# Patient Record
Sex: Female | Born: 2000
Health system: Southern US, Community
[De-identification: ages and names within clinical notes are randomized; demographics above are authoritative.]

## PROBLEM LIST (undated history)

## (undated) DIAGNOSIS — Z8619 Personal history of other infectious and parasitic diseases: Secondary | ICD-10-CM

## (undated) DIAGNOSIS — Z789 Other specified health status: Secondary | ICD-10-CM

## (undated) HISTORY — DX: Other specified health status: Z78.9

## (undated) HISTORY — PX: NO PAST SURGERIES: SHX2092

---
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
## 2018-06-22 ENCOUNTER — Other Ambulatory Visit: Payer: Self-pay

## 2018-06-22 ENCOUNTER — Emergency Department (HOSPITAL_COMMUNITY)
Admission: EM | Admit: 2018-06-22 | Discharge: 2018-06-22 | Disposition: A | Discharge status: Home / Self Care | Payer: BLUE CROSS/BLUE SHIELD | Attending: Emergency Medicine | Admitting: Emergency Medicine

## 2018-06-22 ENCOUNTER — Encounter (HOSPITAL_COMMUNITY): Payer: Self-pay

## 2018-06-22 DIAGNOSIS — B9789 Other viral agents as the cause of diseases classified elsewhere: Secondary | ICD-10-CM | POA: Insufficient documentation

## 2018-06-22 DIAGNOSIS — Z7722 Contact with and (suspected) exposure to environmental tobacco smoke (acute) (chronic): Secondary | ICD-10-CM | POA: Insufficient documentation

## 2018-06-22 DIAGNOSIS — R0789 Other chest pain: Secondary | ICD-10-CM | POA: Insufficient documentation

## 2018-06-22 DIAGNOSIS — J040 Acute laryngitis: Secondary | ICD-10-CM | POA: Insufficient documentation

## 2018-06-22 DIAGNOSIS — J069 Acute upper respiratory infection, unspecified: Secondary | ICD-10-CM

## 2018-06-22 DIAGNOSIS — J039 Acute tonsillitis, unspecified: Secondary | ICD-10-CM

## 2018-06-22 DIAGNOSIS — R490 Dysphonia: Secondary | ICD-10-CM | POA: Insufficient documentation

## 2018-06-22 LAB — GROUP A STREP BY PCR: Group A Strep by PCR: NOT DETECTED

## 2018-06-22 NOTE — ED Triage Notes (Signed)
Pt states she has had a cough, sore throat, and URI symptoms over the weekend. Pt states that she has been taking Tylenol Cold and Flu without relief. Last took it last night.

## 2018-06-22 NOTE — Discharge Instructions (Signed)
You were seen in the ER for cough, sore throat.  Your symptoms are most likely from a virus that has caused an upper respiratory infection. Strep test was negative. A viral illness typically peaks on day 2-3 and resolves after one week.  The main treatment approach for a viral upper respiratory infection is to treat the symptoms, support your immune system and prevent spread of illness.    Given current outbreak we are recommending you stay at home for at least 3 days AFTER complete resolution of all symptoms, at that time you may return to work.   Stay well-hydrated. Rest. You can use over the counter medications to help with symptoms: 600 mg ibuprofen (motrin, aleve, advil) or acetaminophen (tylenol) every 6 hours, around the clock to help with associated fevers, sore throat, headaches, generalized body aches and malaise.  Oxymetazoline (afrin) intranasal spray once daily for no more than 3 days to help with congestion, after 3 days you can switch to another over-the-counter nasal steroid spray such as fluticasone (flonase) Allergy medication (loratadine, cetirizine, etc) and phenylephrine (sudafed) help with nasal congestion, runny nose and postnasal drip.   Dextromethorphan (Delsym) to suppress dry cough  Wash your hands often to prevent spread.    A viral upper respiratory infection can also worsen and progress into pneumonia.  Monitor your symptoms. Return for persistent fevers, chest pain, worsening cough, shortness of breath, worsening sore throat, difficulty swallowing drooling

## 2018-06-22 NOTE — ED Provider Notes (Addendum)
[facility] Provider Note   CSN: [id number] Arrival date & time: 06/22/18  1516    History   Chief Complaint Chief Complaint  Patient presents with  . URI    HPI Ann Boyd is a 18 y.o. female is here for evaluation of cough.  3 days ago.  Described as dry, sometimes she can cough up some mucus that is clear.  Associated with sore throat, voice hoarseness, congestion but no rhinorrhea, chest wall pain when she is coughing forcefully.  Has been taking Tylenol head and cold every 6 hours with mild, temporary relief.  Reports some of her coworkers have had influenza and pneumonia recently.  No cigarette use.  No lung disease.  No known medical problems.  Does not take any medicines daily.  No car or air travel in the last 6 weeks.  No exposure to any confirmed case of coronavirus.  She denies any fever, chest pain, shortness of breath, nausea, vomiting, abdominal pain, diarrhea.  No alleviating or aggravating factors.      HPI  History reviewed. No pertinent past medical history.  Patient Active Problem List   Diagnosis Date Noted  . Contraception 11/26/2012  . Encounter for medical examination to establish care 04/10/2011    History reviewed. No pertinent surgical history.   OB History   No obstetric history on file.      Home Medications    Prior to Admission medications   Medication Sig Start Date End Date Taking? Authorizing Provider  medroxyPROGESTERone (DEPO-PROVERA) 150 MG/ML injection Inject 1 mL (150 mg total) into the muscle every 3 (three) months. 11/26/12 11/26/13  Dessa Phi, MD    Family History Family History  Problem Relation Age of Onset  . Healthy Mother   . Healthy Father     Social History Social History   Tobacco Use  . Smoking status: Passive Smoke Exposure - Never Smoker  . Smokeless tobacco: Never Used  Substance Use Topics  . Alcohol use: No  . Drug use: No     Allergies   Patient has no  known allergies.   Review of Systems Review of Systems  HENT: Positive for congestion, sore throat and voice change.   Respiratory: Positive for cough.   All other systems reviewed and are negative.    Physical Exam Updated Vital Signs BP 122/80 (BP Location: Left Arm)   Pulse 82   Temp 98.5 F (36.9 C) (Oral)   Resp 16   Wt 78 kg   SpO2 100%   BMI 28.62 kg/m   Physical Exam Vitals signs and nursing note reviewed.  Constitutional:      General: She is not in acute distress.    Appearance: She is well-developed.     Comments: NAD.  HENT:     Head: Normocephalic and atraumatic.     Right Ear: External ear normal.     Left Ear: External ear normal.     Nose: Congestion present.     Comments: Mild enlarged turbinates, mild mucosal erythema.  No rhinorrhea.  No sinus tenderness.    Mouth/Throat:     Pharynx: Posterior oropharyngeal erythema present.     Comments: Mildly, symmetrically enlarged tonsils bilaterally with erythema.  Oropharynx is erythematous.  No exudates, petechiae.  Uvula is midline.  No trismus.  Normal sublingual space and tongue protrusion.  Scratchy/hoarse voice noted but no hot potato/muffled voice.  No stridor.  Tolerating secretions. Eyes:     General: No scleral icterus.  Conjunctiva/sclera: Conjunctivae normal.  Neck:     Musculoskeletal: Normal range of motion and neck supple.     Comments: Mildly tender, symmetrically enlarged submandibular lymph nodes bilaterally.  Trachea midline. Cardiovascular:     Rate and Rhythm: Normal rate and regular rhythm.     Heart sounds: Normal heart sounds.  Pulmonary:     Effort: Pulmonary effort is normal.     Breath sounds: Normal breath sounds.     Comments: Normal work of breathing.  No wheezing, crackles. Musculoskeletal: Normal range of motion.        General: No deformity.  Lymphadenopathy:     Cervical: Cervical adenopathy present.  Skin:    General: Skin is warm and dry.     Capillary Refill:  Capillary refill takes less than 2 seconds.  Neurological:     Mental Status: She is alert and oriented to person, place, and time.  Psychiatric:        Behavior: Behavior normal.        Thought Content: Thought content normal.        Judgment: Judgment normal.      ED Treatments / Results  Labs (all labs ordered are listed, but only abnormal results are displayed) Labs Reviewed  GROUP A STREP BY PCR    EKG None  Radiology No results found.  Procedures Procedures (including critical care time)  Medications Ordered in ED Medications - No data to display   Initial Impression / Assessment and Plan / ED Course  I have reviewed the triage vital signs and the nursing notes.  Pertinent labs & imaging results that were available during my care of the patient were reviewed by me and considered in my medical decision making (see chart for details).       18 year old here with URI symptoms.  Known sick contacts at work.  History and exam consistent with pharyngitis versus laryngitis versus viral URI.  Nontoxic, afebrile, without increased work of breathing, fever, tachypnea, hypoxia.  Lung exam reassuring without adventitious lung sounds.  Given reassuring exam I do not think emergent chest x-ray is indicated here.  Doubt pneumonia.  Otherwise immunocompetent.  Rapid strep is negative, her Centor score is low.  We will defer antibiotics right now.  She denies exposure to known coronavirus patients or recent travel to high risk areas.  I have low suspicion for this in this patient.  No indication for further emergent lab work, imaging.  Throat exam reassuring w/o signs to suggest RPA, PTA, ludwig's.  Will discharge with symptomatic management. Given symptoms will recommend monitoring symptoms at home for 3 days after resolution of symptoms.   Final Clinical Impressions(s) / ED Diagnoses   Final diagnoses:  Viral URI with cough  Laryngitis  Tonsillitis    ED Discharge Orders     None        Liberty Handy, PA-C 06/22/18 1719    Mancel Bale, MD 06/25/18 [id number]

## 2018-09-30 DIAGNOSIS — Z8619 Personal history of other infectious and parasitic diseases: Secondary | ICD-10-CM

## 2018-09-30 HISTORY — DX: Personal history of other infectious and parasitic diseases: Z86.19

## 2018-10-13 ENCOUNTER — Encounter (HOSPITAL_COMMUNITY): Payer: Self-pay | Admitting: Emergency Medicine

## 2018-10-13 ENCOUNTER — Other Ambulatory Visit: Payer: Self-pay

## 2018-10-13 ENCOUNTER — Ambulatory Visit (HOSPITAL_COMMUNITY)
Admission: EM | Admit: 2018-10-13 | Discharge: 2018-10-13 | Disposition: A | Discharge status: Home / Self Care | Payer: BC Managed Care – PPO | Attending: Internal Medicine | Admitting: Internal Medicine

## 2018-10-13 DIAGNOSIS — A64 Unspecified sexually transmitted disease: Secondary | ICD-10-CM

## 2018-10-13 LAB — POCT PREGNANCY, URINE: Preg Test, Ur: NEGATIVE

## 2018-10-13 MED ORDER — AZITHROMYCIN 250 MG PO TABS
1000.0000 mg | ORAL_TABLET | Freq: Once | ORAL | Status: AC
  Administered 2018-10-13: 1000 mg via ORAL

## 2018-10-13 MED ORDER — CEFTRIAXONE SODIUM 250 MG IJ SOLR
250.0000 mg | Freq: Once | INTRAMUSCULAR | Status: AC
  Administered 2018-10-13: 250 mg via INTRAMUSCULAR

## 2018-10-13 MED ORDER — METRONIDAZOLE 500 MG PO TABS: 500.0000 mg | ORAL_TABLET | Freq: Two times a day (BID) | ORAL | 0 refills | Status: DC

## 2018-10-13 MED ORDER — AZITHROMYCIN 250 MG PO TABS
ORAL_TABLET | ORAL | Status: AC
  Filled 2018-10-13: qty 1

## 2018-10-13 MED ORDER — CEFTRIAXONE SODIUM 250 MG IJ SOLR
INTRAMUSCULAR | Status: AC
  Filled 2018-10-13: qty 250

## 2018-10-13 NOTE — ED Notes (Signed)
Urinalysis testing exceeded the limitations of the [employer] capabilities. Notified provider, urine culture was obtained.

## 2018-10-13 NOTE — Discharge Instructions (Addendum)
You were treated today with 2 antibiotics, Rocephin and Zithromax.  You also need to take the antibiotic prescribed, metronidazole; the prescription was called into your pharmacy.   Do not have sex for 7 days.  If your vaginal tests come back positive for an STD, your sexual partner will need to be treated.   Return here or follow-up with your primary care provider if you develop fever, chills, abdominal pain, back pain, worsening symptoms.

## 2018-10-13 NOTE — ED Triage Notes (Signed)
Concerns for std.  Patient reports vaginal discharge for 1-2 weeks.  Patient reports there is an odor.

## 2018-10-13 NOTE — ED Provider Notes (Signed)
[place]    CSN: [id number] Arrival date & time: 10/13/18  1200     History   Chief Complaint Chief Complaint  Patient presents with  . SEXUALLY TRANSMITTED DISEASE    HPI Ann Boyd is a 18 y.o. female.   Patient presents with yellow vaginal discharge for 2 weeks.  She is sexually active with one partner x2 months.  She denies fever, chills, abdominal pain, dysuria, back pain.  She has history of STDs.  LMP: 09/09/2018.  The history is provided by the patient.    History reviewed. No pertinent past medical history.  Patient Active Problem List   Diagnosis Date Noted  . Contraception 11/26/2012  . Encounter for medical examination to establish care 04/10/2011    History reviewed. No pertinent surgical history.  OB History   No obstetric history on file.      Home Medications    Prior to Admission medications   Medication Sig Start Date End Date Taking? Authorizing Provider  medroxyPROGESTERone (DEPO-PROVERA) 150 MG/ML injection Inject 1 mL (150 mg total) into the muscle every 3 (three) months. 11/26/12 11/26/13  Boykin Nearing, MD  metroNIDAZOLE (FLAGYL) 500 MG tablet Take 1 tablet (500 mg total) by mouth 2 (two) times daily. 10/13/18   Sharion Balloon, NP    Family History Family History  Problem Relation Age of Onset  . Healthy Mother   . Healthy Father     Social History Social History   Tobacco Use  . Smoking status: Passive Smoke Exposure - Never Smoker  . Smokeless tobacco: Never Used  Substance Use Topics  . Alcohol use: No  . Drug use: No     Allergies   Patient has no known allergies.   Review of Systems Review of Systems  Constitutional: Negative for chills and fever.  HENT: Negative for ear pain and sore throat.   Eyes: Negative for pain and visual disturbance.  Respiratory: Negative for cough and shortness of breath.   Cardiovascular: Negative for chest pain and palpitations.  Gastrointestinal: Negative for  abdominal pain and vomiting.  Genitourinary: Positive for vaginal discharge. Negative for dysuria, flank pain, hematuria and pelvic pain.  Musculoskeletal: Negative for arthralgias and back pain.  Skin: Negative for color change and rash.  Neurological: Negative for seizures and syncope.  All other systems reviewed and are negative.    Physical Exam Triage Vital Signs ED Triage Vitals  Enc Vitals Group     BP 10/13/18 1223 119/70     Pulse Rate 10/13/18 1223 74     Resp 10/13/18 1223 16     Temp 10/13/18 1223 98.4 F (36.9 C)     Temp Source 10/13/18 1223 Oral     SpO2 10/13/18 1223 100 %     Weight --      Height --      Head Circumference --      Peak Flow --      Pain Score 10/13/18 1221 0     Pain Loc --      Pain Edu? --      Excl. in [place]? --    No data found.  Updated Vital Signs BP 119/70 (BP Location: Right Arm)   Pulse 74   Temp 98.4 F (36.9 C) (Oral)   Resp 16   LMP 09/09/2018   SpO2 100%   Visual Acuity Right Eye Distance:   Left Eye Distance:   Bilateral Distance:    Right Eye Near:  Left Eye Near:    Bilateral Near:     Physical Exam Vitals signs and nursing note reviewed.  Constitutional:      General: She is not in acute distress.    Appearance: She is well-developed.  HENT:     Head: Normocephalic and atraumatic.  Eyes:     Conjunctiva/sclera: Conjunctivae normal.  Neck:     Musculoskeletal: Neck supple.  Cardiovascular:     Rate and Rhythm: Normal rate and regular rhythm.     Heart sounds: No murmur.  Pulmonary:     Effort: Pulmonary effort is normal. No respiratory distress.     Breath sounds: Normal breath sounds.  Abdominal:     Palpations: Abdomen is soft.     Tenderness: There is no abdominal tenderness. There is no right CVA tenderness, left CVA tenderness, guarding or rebound.  Skin:    General: Skin is warm and dry.  Neurological:     Mental Status: She is alert and oriented to person, place, and time.      UC  Treatments / Results  Labs (all labs ordered are listed, but only abnormal results are displayed) Labs Reviewed  URINE CULTURE  POCT PREGNANCY, URINE  CERVICOVAGINAL ANCILLARY ONLY    EKG   Radiology No results found.  Procedures Procedures (including critical care time)  Medications Ordered in UC Medications  azithromycin (ZITHROMAX) tablet 1,000 mg (has no administration in time range)  cefTRIAXone (ROCEPHIN) injection 250 mg (has no administration in time range)  azithromycin (ZITHROMAX) 250 MG tablet (has no administration in time range)  cefTRIAXone (ROCEPHIN) 250 MG injection (has no administration in time range)    Initial Impression / Assessment and Plan / UC Course  I have reviewed the triage vital signs and the nursing notes.  Pertinent labs & imaging results that were available during my care of the patient were reviewed by me and considered in my medical decision making (see chart for details).   Sexually transmitted disease.  Treated today with Rocephin and Zithromax here; prescription sent in for metronidazole.  Discussed with patient that she will need to abstain from sex x7 days.  Discussed that her sexual partner will need treatment if her vaginal test come back positive for an STD.  Discussed that she should return here or follow-up with her primary care if she develops fever, chills, abdominal pain, dysuria, back pain, worsening symptoms.    Final Clinical Impressions(s) / UC Diagnoses   Final diagnoses:  STD (female)     Discharge Instructions     You were treated today with 2 antibiotics, Rocephin and Zithromax.  You also need to take the antibiotic prescribed, metronidazole; the prescription was called into your pharmacy.   Do not have sex for 7 days.  If your vaginal tests come back positive for an STD, your sexual partner will need to be treated.   Return here or follow-up with your primary care provider if you develop fever, chills, abdominal  pain, back pain, worsening symptoms.       ED Prescriptions    Medication Sig Dispense Auth. Provider   metroNIDAZOLE (FLAGYL) 500 MG tablet Take 1 tablet (500 mg total) by mouth 2 (two) times daily. 14 tablet Mickie Bailate, Benjamyn Hestand H, NP     Controlled Substance Prescriptions [place] Controlled Substance Registry consulted? Not Applicable   Mickie Bailate, Churchill Grimsley H, NP 10/13/18 1254

## 2018-10-14 LAB — URINE CULTURE: Culture: 30000 — AB

## 2018-10-15 LAB — CERVICOVAGINAL ANCILLARY ONLY
Bacterial vaginitis: POSITIVE — AB
Chlamydia: POSITIVE — AB
Neisseria Gonorrhea: POSITIVE — AB
Trichomonas: NEGATIVE

## 2018-10-16 ENCOUNTER — Telehealth (HOSPITAL_COMMUNITY): Payer: Self-pay | Admitting: Emergency Medicine

## 2018-10-16 NOTE — Telephone Encounter (Signed)
Bacterial Vaginosis test is positive.  Prescription for metronidazole was given at the urgent care visit. Pt contacted regarding results. Answered all questions. Verbalized understanding.  Chlamydia is positive.  This was treated at the urgent care visit with po zithromax 1g.  Pt needs education to please refrain from sexual intercourse for 7 days to give the medicine time to work.  Sexual partners need to be notified and tested/treated.  Condoms may reduce risk of reinfection.  Recheck or followup with PCP for further evaluation if symptoms are not improving.  [facility] notified.  Test for gonorrhea was positive. This was treated at the urgent care visit with IM rocephin 250mg  and po zithromax 1g. Pt needs education to refrain from sexual intercourse for 7 days after treatment to give the medicine time to work. Sexual partners need to be notified and tested/treated. Condoms may reduce risk of reinfection. Recheck or followup with PCP for further evaluation if symptoms are not improving. [facility] notified.   Patient contacted and made aware of all results, all questions answered.

## 2018-11-30 ENCOUNTER — Ambulatory Visit (HOSPITAL_COMMUNITY)
Admission: EM | Admit: 2018-11-30 | Discharge: 2018-11-30 | Disposition: A | Discharge status: Home / Self Care | Payer: BC Managed Care – PPO | Attending: Emergency Medicine | Admitting: Emergency Medicine

## 2018-11-30 ENCOUNTER — Encounter (HOSPITAL_COMMUNITY): Payer: Self-pay | Admitting: Emergency Medicine

## 2018-11-30 ENCOUNTER — Other Ambulatory Visit: Payer: Self-pay

## 2018-11-30 DIAGNOSIS — N760 Acute vaginitis: Secondary | ICD-10-CM | POA: Insufficient documentation

## 2018-11-30 DIAGNOSIS — Z3202 Encounter for pregnancy test, result negative: Secondary | ICD-10-CM | POA: Diagnosis not present

## 2018-11-30 LAB — POCT URINALYSIS DIP (DEVICE)
Glucose, UA: NEGATIVE mg/dL
Ketones, ur: NEGATIVE mg/dL
Nitrite: NEGATIVE
Protein, ur: 100 mg/dL — AB
Specific Gravity, Urine: 1.025 (ref 1.005–1.030)
Urobilinogen, UA: 0.2 mg/dL (ref 0.0–1.0)
pH: 7 (ref 5.0–8.0)

## 2018-11-30 LAB — POCT PREGNANCY, URINE: Preg Test, Ur: NEGATIVE

## 2018-11-30 MED ORDER — FLUCONAZOLE 150 MG PO TABS: 150.0000 mg | ORAL_TABLET | Freq: Once | ORAL | 0 refills | Status: AC

## 2018-11-30 NOTE — ED Triage Notes (Signed)
Seen 7/14 and while taking medication thinks she developed a yeast infection.  Patient took monistat.  Patient thinks over time symptoms improved with the exception of the odor.

## 2018-11-30 NOTE — Discharge Instructions (Signed)
Take diflucan today, may repeat in a few days if still having symptoms Swab pending to check for causes of odor  Follow up if symptoms not resolved

## 2018-12-01 NOTE — ED Provider Notes (Signed)
[facility]    CSN: [id number] Arrival date & time: 11/30/18  1428      History   Chief Complaint Chief Complaint  Patient presents with  . Appointment    2:30  . SEXUALLY TRANSMITTED DISEASE    HPI Ann Boyd is a 18 y.o. female no significant past medical history presenting today for evaluation of possible yeast infection.  Patient states that on 7/14 she was seen here and tested for STDs.  She tested positive for gonorrhea, chlamydia and bacterial vaginosis.  She took the medicine for all of these.  She felt her symptoms at the time improved except for vaginal odor.  She has had a persistent vaginal odor which has been similar to when she has had BV in the past.  She had some itching and irritation after taking the antibiotics and took an over-the-counter Monistat, but is concerned that this did not fully cure yeast infection.  She has not had any pelvic pain or abdominal pain.  Denies any fevers nausea or vomiting.  Denies any new partners or any concerns for STDs.  Denies any rashes or lesions.  Denies urinary symptoms of dysuria, increased frequency or urgency.  HPI  History reviewed. No pertinent past medical history.  Patient Active Problem List   Diagnosis Date Noted  . Contraception 11/26/2012  . Encounter for medical examination to establish care 04/10/2011    History reviewed. No pertinent surgical history.  OB History   No obstetric history on file.      Home Medications    Prior to Admission medications   Medication Sig Start Date End Date Taking? Authorizing Provider  medroxyPROGESTERone (DEPO-PROVERA) 150 MG/ML injection Inject 1 mL (150 mg total) into the muscle every 3 (three) months. 11/26/12 11/30/18  Dessa PhiFunches, Josalyn, MD    Family History Family History  Problem Relation Age of Onset  . Healthy Mother   . Healthy Father     Social History Social History   Tobacco Use  . Smoking status: Passive Smoke Exposure - Never Smoker  .  Smokeless tobacco: Never Used  Substance Use Topics  . Alcohol use: No  . Drug use: No     Allergies   Patient has no known allergies.   Review of Systems Review of Systems  Constitutional: Negative for fever.  Respiratory: Negative for shortness of breath.   Cardiovascular: Negative for chest pain.  Gastrointestinal: Negative for abdominal pain, diarrhea, nausea and vomiting.  Genitourinary: Negative for dysuria, flank pain, genital sores, hematuria, menstrual problem, vaginal bleeding, vaginal discharge and vaginal pain.  Musculoskeletal: Negative for back pain.  Skin: Negative for rash.  Neurological: Negative for dizziness, light-headedness and headaches.     Physical Exam Triage Vital Signs ED Triage Vitals  Enc Vitals Group     BP 11/30/18 1448 117/89     Pulse Rate 11/30/18 1448 83     Resp --      Temp 11/30/18 1448 98.1 F (36.7 C)     Temp Source 11/30/18 1448 Oral     SpO2 11/30/18 1448 99 %     Weight --      Height --      Head Circumference --      Peak Flow --      Pain Score 11/30/18 1446 0     Pain Loc --      Pain Edu? --      Excl. in [place]? --    No data found.  Updated  Vital Signs BP 117/89 (BP Location: Left Arm)   Pulse 83   Temp 98.1 F (36.7 C) (Oral)   LMP 10/26/2018   SpO2 99%   Visual Acuity Right Eye Distance:   Left Eye Distance:   Bilateral Distance:    Right Eye Near:   Left Eye Near:    Bilateral Near:     Physical Exam Vitals signs and nursing note reviewed.  Constitutional:      Appearance: She is well-developed.     Comments: No acute distress  HENT:     Head: Normocephalic and atraumatic.     Nose: Nose normal.  Eyes:     Conjunctiva/sclera: Conjunctivae normal.  Neck:     Musculoskeletal: Neck supple.  Cardiovascular:     Rate and Rhythm: Normal rate.  Pulmonary:     Effort: Pulmonary effort is normal. No respiratory distress.  Abdominal:     General: There is no distension.     Comments: Abdomen soft,  nondistended, nontender to light palpation throughout entire abdomen  Musculoskeletal: Normal range of motion.  Skin:    General: Skin is warm and dry.  Neurological:     Mental Status: She is alert and oriented to person, place, and time.      UC Treatments / Results  Labs (all labs ordered are listed, but only abnormal results are displayed) Labs Reviewed  POCT URINALYSIS DIP (DEVICE) - Abnormal; Notable for the following components:      Result Value   Bilirubin Urine SMALL (*)    Hgb urine dipstick TRACE (*)    Protein, ur 100 (*)    Leukocytes,Ua SMALL (*)    All other components within normal limits  URINE CULTURE  POC URINE PREG, ED  POCT PREGNANCY, URINE  CERVICOVAGINAL ANCILLARY ONLY    EKG   Radiology No results found.  Procedures Procedures (including critical care time)  Medications Ordered in UC Medications - No data to display  Initial Impression / Assessment and Plan / UC Course  I have reviewed the triage vital signs and the nursing notes.  Pertinent labs & imaging results that were available during my care of the patient were reviewed by me and considered in my medical decision making (see chart for details).     Will empirically treat for yeast with Diflucan today given patient was on antibiotics.  UA with small leuks, will send for culture.  But this is less likely because of the odor, but will check to confirm.  Rechecking STDs to ensure resolution.Discussed strict return precautions. Patient verbalized understanding and is agreeable with plan.  Final Clinical Impressions(s) / UC Diagnoses   Final diagnoses:  Vaginitis and vulvovaginitis     Discharge Instructions     Take diflucan today, may repeat in a few days if still having symptoms Swab pending to check for causes of odor  Follow up if symptoms not resolved   ED Prescriptions    Medication Sig Dispense Auth. Provider   fluconazole (DIFLUCAN) 150 MG tablet Take 1 tablet (150 mg  total) by mouth once for 1 dose. 2 tablet Wieters, Hallie C, PA-C     Controlled Substance Prescriptions [place] Controlled Substance Registry consulted? Not Applicable   Janith Lima, [place] 12/01/18 8938

## 2018-12-02 ENCOUNTER — Telehealth (HOSPITAL_COMMUNITY): Payer: Self-pay | Admitting: Emergency Medicine

## 2018-12-02 LAB — CERVICOVAGINAL ANCILLARY ONLY
Bacterial vaginitis: POSITIVE — AB
Candida vaginitis: POSITIVE — AB
Chlamydia: NEGATIVE
Neisseria Gonorrhea: NEGATIVE
Trichomonas: NEGATIVE

## 2018-12-02 LAB — URINE CULTURE

## 2018-12-02 MED ORDER — FLUCONAZOLE 150 MG PO TABS: 150.0000 mg | ORAL_TABLET | Freq: Once | ORAL | 0 refills | Status: AC

## 2018-12-02 MED ORDER — METRONIDAZOLE 500 MG PO TABS: 500.0000 mg | ORAL_TABLET | Freq: Two times a day (BID) | ORAL | 0 refills | Status: AC

## 2018-12-02 NOTE — Telephone Encounter (Signed)
Bacterial vaginosis is positive. This was not treated at the urgent care visit.  Flagyl 500 mg BID x 7 days #14 no refills sent to patients pharmacy of choice.    Candida (yeast) is positive.  Prescription for fluconazole was given at the urgent care visit.    Patient contacted and made aware of    results, all questions answered  Pt did not pick up medicine at [facility], states her insurance needs the meds sent to [facility]. Will resent diflucan as well.

## 2019-01-18 ENCOUNTER — Encounter (HOSPITAL_COMMUNITY): Payer: Self-pay | Admitting: *Deleted

## 2019-01-18 ENCOUNTER — Other Ambulatory Visit: Payer: Self-pay

## 2019-01-18 ENCOUNTER — Emergency Department (HOSPITAL_COMMUNITY)
Admission: EM | Admit: 2019-01-18 | Discharge: 2019-01-18 | Discharge status: Absent without leave | Payer: BC Managed Care – PPO

## 2019-01-18 ENCOUNTER — Inpatient Hospital Stay (HOSPITAL_COMMUNITY): Payer: Medicaid Other

## 2019-01-18 ENCOUNTER — Inpatient Hospital Stay (HOSPITAL_COMMUNITY)
Admission: AD | Admit: 2019-01-18 | Discharge: 2019-01-18 | Disposition: A | Discharge status: Home / Self Care | Payer: Medicaid Other | Attending: Obstetrics & Gynecology | Admitting: Obstetrics & Gynecology

## 2019-01-18 DIAGNOSIS — Z7722 Contact with and (suspected) exposure to environmental tobacco smoke (acute) (chronic): Secondary | ICD-10-CM | POA: Insufficient documentation

## 2019-01-18 DIAGNOSIS — R109 Unspecified abdominal pain: Secondary | ICD-10-CM | POA: Diagnosis present

## 2019-01-18 DIAGNOSIS — O3680X0 Pregnancy with inconclusive fetal viability, not applicable or unspecified: Secondary | ICD-10-CM

## 2019-01-18 DIAGNOSIS — O26891 Other specified pregnancy related conditions, first trimester: Secondary | ICD-10-CM | POA: Diagnosis not present

## 2019-01-18 DIAGNOSIS — Z3A01 Less than 8 weeks gestation of pregnancy: Secondary | ICD-10-CM | POA: Insufficient documentation

## 2019-01-18 LAB — CBC
HCT: 36.4 % (ref 36.0–46.0)
Hemoglobin: 11.7 g/dL — ABNORMAL LOW (ref 12.0–15.0)
MCH: 27.8 pg (ref 26.0–34.0)
MCHC: 32.1 g/dL (ref 30.0–36.0)
MCV: 86.5 fL (ref 80.0–100.0)
Platelets: 221 10*3/uL (ref 150–400)
RBC: 4.21 MIL/uL (ref 3.87–5.11)
RDW: 13.2 % (ref 11.5–15.5)
WBC: 6.3 10*3/uL (ref 4.0–10.5)
nRBC: 0 % (ref 0.0–0.2)

## 2019-01-18 LAB — URINALYSIS, ROUTINE W REFLEX MICROSCOPIC
Bilirubin Urine: NEGATIVE
Glucose, UA: NEGATIVE mg/dL
Ketones, ur: 20 mg/dL — AB
Nitrite: NEGATIVE
Protein, ur: 30 mg/dL — AB
Specific Gravity, Urine: 1.026 (ref 1.005–1.030)
pH: 5 (ref 5.0–8.0)

## 2019-01-18 LAB — POCT PREGNANCY, URINE: Preg Test, Ur: POSITIVE — AB

## 2019-01-18 LAB — WET PREP, GENITAL
Clue Cells Wet Prep HPF POC: NONE SEEN
Sperm: NONE SEEN
Trich, Wet Prep: NONE SEEN
Yeast Wet Prep HPF POC: NONE SEEN

## 2019-01-18 LAB — HIV ANTIBODY (ROUTINE TESTING W REFLEX): HIV Screen 4th Generation wRfx: NONREACTIVE

## 2019-01-18 LAB — ABO/RH: ABO/RH(D): O POS

## 2019-01-18 LAB — HCG, QUANTITATIVE, PREGNANCY: hCG, Beta Chain, Quant, S: 334 m[IU]/mL — ABNORMAL HIGH (ref <5)

## 2019-01-18 NOTE — Discharge Instructions (Signed)
Return to care  If you have heavier bleeding that soaks through more that 2 pads per hour for an hour or more If you bleed so much that you feel like you might pass out or you do pass out If you have significant abdominal pain that is not improved with Tylenol     Safe Medications in Pregnancy   Acne: Benzoyl Peroxide Salicylic Acid  Backache/Headache: Tylenol: 2 regular strength every 4 hours OR              2 Extra strength every 6 hours  Colds/Coughs/Allergies: Benadryl (alcohol free) 25 mg every 6 hours as needed Breath right strips Claritin Cepacol throat lozenges Chloraseptic throat spray Cold-Eeze- up to three times per day Cough drops, alcohol free Flonase (by prescription only) Guaifenesin Mucinex Robitussin DM (plain only, alcohol free) Saline nasal spray/drops Sudafed (pseudoephedrine) & Actifed ** use only after [redacted] weeks gestation and if you do not have high blood pressure Tylenol Vicks Vaporub Zinc lozenges Zyrtec   Constipation: Colace Ducolax suppositories Fleet enema Glycerin suppositories Metamucil Milk of magnesia Miralax Senokot Smooth move tea  Diarrhea: Kaopectate Imodium A-D  *NO pepto Bismol  Hemorrhoids: Anusol Anusol HC Preparation H Tucks  Indigestion: Tums Maalox Mylanta Zantac  Pepcid  Insomnia: Benadryl (alcohol free) 25mg every 6 hours as needed Tylenol PM Unisom, no Gelcaps  Leg Cramps: Tums MagGel  Nausea/Vomiting:  Bonine Dramamine Emetrol Ginger extract Sea bands Meclizine  Nausea medication to take during pregnancy:  Unisom (doxylamine succinate 25 mg tablets) Take one tablet daily at bedtime. If symptoms are not adequately controlled, the dose can be increased to a maximum recommended dose of two tablets daily (1/2 tablet in the morning, 1/2 tablet mid-afternoon and one at bedtime). Vitamin B6 100mg tablets. Take one tablet twice a day (up to 200 mg per day).  Skin Rashes: Aveeno  products Benadryl cream or 25mg every 6 hours as needed Calamine Lotion 1% cortisone cream  Yeast infection: Gyne-lotrimin 7 Monistat 7  Gum/tooth pain: Anbesol  **If taking multiple medications, please check labels to avoid duplicating the same active ingredients **take medication as directed on the label ** Do not exceed 4000 mg of tylenol in 24 hours **Do not take medications that contain aspirin or ibuprofen    

## 2019-01-18 NOTE — MAU Provider Note (Signed)
Chief Complaint: Possible Pregnancy and Abdominal Pain   First Provider Initiated Contact with Patient 01/18/19 0855     SUBJECTIVE HPI: Ann Boyd is a 18 y.o. G1P0 at [id number] who presents to Maternity Admissions reporting abdominal cramping.  Had a negative pregnancy test 3 days ago, this morning she had a positive test. Has had irregular cycles since discontinuing depo provera last year.  Reports lower abdominal cramping in the suprapubic area for the last 2 days. Pain is intermittent. Rates pain 8/10. Nothing makes better or worse. Hasn't treated her symptoms.  Denies fever/chills, n/v/d, dysuria, or vaginal bleeding. Has had some tan/brown discharge; no odor or vaginal irritation. Last BM was yesterday. Does not have an ob/gyn.    Past Medical History:  Diagnosis Date  . Hx of chlamydia infection 09/2018  . Hx of gonorrhea 09/2018  . Hx of trichomoniasis 09/2017   OB History  Gravida Para Term Preterm AB Living  1            SAB TAB Ectopic Multiple Live Births               # Outcome Date GA Lbr Len/2nd Weight Sex Delivery Anes PTL Lv  1 Current            History reviewed. No pertinent surgical history. Social History   Socioeconomic History  . Marital status: Single    Spouse name: Not on file  . Number of children: Not on file  . Years of education: Not on file  . Highest education level: Not on file  Occupational History  . Not on file  Social Needs  . Financial resource strain: Not on file  . Food insecurity    Worry: Not on file    Inability: Not on file  . Transportation needs    Medical: Not on file    Non-medical: Not on file  Tobacco Use  . Smoking status: Passive Smoke Exposure - Never Smoker  . Smokeless tobacco: Never Used  Substance and Sexual Activity  . Alcohol use: No  . Drug use: No  . Sexual activity: Never    Birth control/protection: None  Lifestyle  . Physical activity    Days per week: Not on file    Minutes per session: Not on  file  . Stress: Not on file  Relationships  . Social Musicianconnections    Talks on phone: Not on file    Gets together: Not on file    Attends religious service: Not on file    Active member of club or organization: Not on file    Attends meetings of clubs or organizations: Not on file    Relationship status: Not on file  . Intimate partner violence    Fear of current or ex partner: Not on file    Emotionally abused: Not on file    Physically abused: Not on file    Forced sexual activity: Not on file  Other Topics Concern  . Not on file  Social History Narrative  . Not on file   Family History  Problem Relation Age of Onset  . Healthy Mother   . Healthy Father    No current facility-administered medications on file prior to encounter.    Current Outpatient Medications on File Prior to Encounter  Medication Sig Dispense Refill  . [DISCONTINUED] medroxyPROGESTERone (DEPO-PROVERA) 150 MG/ML injection Inject 1 mL (150 mg total) into the muscle every 3 (three) months. 1 mL    No Known Allergies  I have reviewed patient's Past Medical Hx, Surgical Hx, Family Hx, Social Hx, medications and allergies.   Review of Systems  Constitutional: Negative.   Gastrointestinal: Positive for abdominal pain. Negative for constipation, diarrhea, nausea and vomiting.  Genitourinary: Positive for vaginal discharge. Negative for dysuria and vaginal bleeding.    OBJECTIVE Patient Vitals for the past 24 hrs:  BP Temp Pulse Resp SpO2 Height Weight  01/18/19 1121 137/69 - 96 16 - - -  01/18/19 0835 119/70 98.2 F (36.8 C) 88 16 100 % - -  01/18/19 0834 - - - - - 5\' 4"  (1.626 m) 76.7 kg   Constitutional: Well-developed, well-nourished female in no acute distress.  Cardiovascular: normal rate & rhythm, no murmur Respiratory: normal rate and effort. Lung sounds clear throughout GI: Abd soft, non-tender, Pos BS x 4. No guarding or rebound tenderness MS: Extremities nontender, no edema, normal  ROM Neurologic: Alert and oriented x 4.    LAB RESULTS Results for orders placed or performed during the hospital encounter of 01/18/19 (from the past 24 hour(s))  Pregnancy, urine POC     Status: Abnormal   Collection Time: 01/18/19  8:37 AM  Result Value Ref Range   Preg Test, Ur POSITIVE (A) NEGATIVE  Urinalysis, Routine w reflex microscopic     Status: Abnormal   Collection Time: 01/18/19  8:44 AM  Result Value Ref Range   Color, Urine YELLOW YELLOW   APPearance HAZY (A) CLEAR   Specific Gravity, Urine 1.026 1.005 - 1.030   pH 5.0 5.0 - 8.0   Glucose, UA NEGATIVE NEGATIVE mg/dL   Hgb urine dipstick MODERATE (A) NEGATIVE   Bilirubin Urine NEGATIVE NEGATIVE   Ketones, ur 20 (A) NEGATIVE mg/dL   Protein, ur 30 (A) NEGATIVE mg/dL   Nitrite NEGATIVE NEGATIVE   Leukocytes,Ua MODERATE (A) NEGATIVE   RBC / HPF 6-10 0 - 5 RBC/hpf   WBC, UA 21-50 0 - 5 WBC/hpf   Bacteria, UA FEW (A) NONE SEEN   Squamous Epithelial / LPF 6-10 0 - 5   Mucus PRESENT   CBC     Status: Abnormal   Collection Time: 01/18/19  9:16 AM  Result Value Ref Range   WBC 6.3 4.0 - 10.5 K/uL   RBC 4.21 3.87 - 5.11 MIL/uL   Hemoglobin 11.7 (L) 12.0 - 15.0 g/dL   HCT 01/20/19 53.6 - 64.4 %   MCV 86.5 80.0 - 100.0 fL   MCH 27.8 26.0 - 34.0 pg   MCHC 32.1 30.0 - 36.0 g/dL   RDW 03.4 74.2 - 59.5 %   Platelets 221 150 - 400 K/uL   nRBC 0.0 0.0 - 0.2 %  ABO/Rh     Status: None   Collection Time: 01/18/19  9:16 AM  Result Value Ref Range   ABO/RH(D) O POS    No rh immune globuloin      NOT A RH IMMUNE GLOBULIN CANDIDATE, PT RH POSITIVE Performed at [facility], [address] [place], [place]   hCG, quantitative, pregnancy     Status: Abnormal   Collection Time: 01/18/19  9:16 AM  Result Value Ref Range   hCG, Beta Chain, Quant, S 334 (H) <5 mIU/mL  HIV Antibody (routine testing w rflx)     Status: None   Collection Time: 01/18/19  9:16 AM  Result Value Ref Range   HIV Screen 4th Generation  wRfx NON REACTIVE NON REACTIVE  Wet prep, genital  Status: Abnormal   Collection Time: 01/18/19 10:26 AM  Result Value Ref Range   Yeast Wet Prep HPF POC NONE SEEN NONE SEEN   Trich, Wet Prep NONE SEEN NONE SEEN   Clue Cells Wet Prep HPF POC NONE SEEN NONE SEEN   WBC, Wet Prep HPF POC MANY (A) NONE SEEN   Sperm NONE SEEN     IMAGING US Ob Less Than 14 Weeks With Ob Transvaginal  Result Date: 01/18/2019 CLINICAL DATA:  Vaginal bleeding and first-trimester pregnancy. For weeks 4 days by LMP. Beta HCG is not known. EXAM: OBSTETRIC <14 WK [place] AND TRANSVAGINAL OB US TECHNIQUE: Both transabdominal and transvaginal ultrasound examinations were performed for complete evaluation of the gestation as well as the maternal uterus, adnexal regions, and pelvic cul-de-sac. Transvaginal technique was performed to assess early pregnancy. COMPARISON:  None. FINDINGS: No intra or extrauterine gestational sac is seen. Right adnexal peripheral hypervascular nodular structure is intra-ovarian and consistent with corpus luteum. Small volume simple pelvic fluid. No extra ovarian adnexal mass. IMPRESSION: Pregnancy of unknown location with small volume simple pelvic fluid and right corpus luteum. Differential considerations include intrauterine gestation too early to be sonographically visualized, spontaneous abortion, or ectopic pregnancy. Consider follow-up ultrasound in 10 days and serial quantitative beta HCG follow-up. Electronically Signed   By: Monte Fantasia M.D.   On: 01/18/2019 10:08    MAU COURSE Orders Placed This Encounter  Procedures  . Wet prep, genital  . US OB LESS THAN 14 WEEKS WITH OB TRANSVAGINAL  . Urinalysis, Routine w reflex microscopic  . CBC  . hCG, quantitative, pregnancy  . HIV Antibody (routine testing w rflx)  . Pregnancy, urine POC  . ABO/Rh  . Discharge patient Discharge disposition: 01-Home or Self Care; Discharge patient date: 01/18/2019   No orders of the defined types were  placed in this encounter.   MDM +UPT UA, wet prep, GC/chlamydia, CBC, ABO/Rh, quant hCG, and [place] today to rule out ectopic pregnancy  Ultrasound shows no IUP & ?CLC.  HCG is 334. Likely early pregnancy as she had a negative pregnancy test 3 days ago, but can't exclude ectopic pregnancy at this time. Will get serial HCGs.   ASSESSMENT 1. Pregnancy of unknown anatomic location   2. Abdominal pain during pregnancy in first trimester     PLAN Discharge home in stable condition. SAB vs ectopic precautions Scheduled for f/u HCG at [facility] on Wednesday GC/CT pending  Allergies as of 01/18/2019   No Known Allergies     Medication List    You have not been prescribed any medications.      Jorje Guild, NP 01/18/2019  12:30 PM

## 2019-01-18 NOTE — MAU Note (Signed)
.   Ann Boyd is a 18 y.o. at Unknown here in MAU reporting: she has not started her cycle and has had cramping for 2 days. + pregnancy test this morning LMP: 12/17/18 Onset of complaint: 2 days Pain score: 8 Vitals:   01/18/19 0835  BP: 119/70  Pulse: 88  Resp: 16  Temp: 98.2 F (36.8 C)  SpO2: 100%     FHT: Lab orders placed from triage:UPT

## 2019-01-20 ENCOUNTER — Ambulatory Visit (INDEPENDENT_AMBULATORY_CARE_PROVIDER_SITE_OTHER): Payer: Self-pay | Admitting: Emergency Medicine

## 2019-01-20 ENCOUNTER — Other Ambulatory Visit: Payer: Self-pay

## 2019-01-20 DIAGNOSIS — O3680X0 Pregnancy with inconclusive fetal viability, not applicable or unspecified: Secondary | ICD-10-CM

## 2019-01-20 LAB — BETA HCG QUANT (REF LAB): hCG Quant: 528 m[IU]/mL

## 2019-01-20 NOTE — Progress Notes (Signed)
Pt here today for beta hcg lab draw. Pt reports having cramping 9/10 and bleeding that requires 2-3 pads daily. Pt reports not taking any tylenol or ibuprofen today.   HCG result 528. Results reviewed with Dr. Kennon Rounds and recommends for pt to return in 48 hours for repeat lab draw. Pt informed of plan of care and given appointment for 10/23 @ 9:40. Pt encouraged to take ibuprofen or tylenol for pain and to return to the hospital immediately if pain worsens or bleeding soaks 1 pad in an hour or less. Pt verbalized understanding and stated "I'm okay". No further questions or concerns at this time.   Loma Sousa, [place] 01/20/19   [phone]

## 2019-01-20 NOTE — Progress Notes (Signed)
Patient seen and assessed by nursing staff.  Agree with documentation and plan.  

## 2019-01-21 LAB — GC/CHLAMYDIA PROBE AMP (~~LOC~~) NOT AT ARMC
Chlamydia: NEGATIVE
Comment: NEGATIVE
Comment: NORMAL
Neisseria Gonorrhea: POSITIVE — AB

## 2019-01-22 ENCOUNTER — Ambulatory Visit (INDEPENDENT_AMBULATORY_CARE_PROVIDER_SITE_OTHER): Payer: Self-pay

## 2019-01-22 ENCOUNTER — Other Ambulatory Visit: Payer: Self-pay

## 2019-01-22 DIAGNOSIS — A549 Gonococcal infection, unspecified: Secondary | ICD-10-CM

## 2019-01-22 DIAGNOSIS — O3680X0 Pregnancy with inconclusive fetal viability, not applicable or unspecified: Secondary | ICD-10-CM

## 2019-01-22 LAB — BETA HCG QUANT (REF LAB): hCG Quant: 899 m[IU]/mL

## 2019-01-22 MED ORDER — AZITHROMYCIN 250 MG PO TABS
1000.0000 mg | ORAL_TABLET | Freq: Once | ORAL | Status: AC
  Administered 2019-01-22: 1000 mg via ORAL

## 2019-01-22 MED ORDER — CEFTRIAXONE SODIUM 250 MG IJ SOLR
250.0000 mg | Freq: Once | INTRAMUSCULAR | Status: AC
  Administered 2019-01-22: 250 mg via INTRAMUSCULAR

## 2019-01-22 NOTE — Progress Notes (Signed)
Pt here today for STAT beta lab.  Pt reports that her pain has subsided and her bleeding is scant.  She reports changing her pad 3-4 times a day.  Pt explained that we will call her in approximately two hours for results and f/u.  Pt verbalized understanding.  Pt states that she tested + for gonorrhea and needs tx per clinical staff that she spoke with.  Per chart review, pt tested positive for gonorrhea.  Pt administered Zithromax 1000 mg orally and Rocephin 250 mg for tx.  Pt tolerated well.   Received notification from [place] that pt's beta results are 899.  Notified Dr. Ilda Basset who recommends pt received OB US in 10 days.  [place] scheduled for 02/02/19 @ 1000.  Notified pt of results and f/u US.  I advise pt that if her pain intensifies and her bleeding increases to where she saturating a pad an hour to please go to MAU.  Pt verbalized understanding.   Mel Almond, RN 01/22/19

## 2019-01-25 NOTE — Progress Notes (Signed)
Patient seen and assessed by nursing staff during this encounter. I have reviewed the chart and agree with the documentation and plan.  Mikah Poss, MD 01/25/2019 11:05 AM    

## 2019-02-02 ENCOUNTER — Other Ambulatory Visit: Payer: Self-pay

## 2019-02-02 ENCOUNTER — Encounter: Payer: Self-pay | Admitting: Family Medicine

## 2019-02-02 ENCOUNTER — Ambulatory Visit (HOSPITAL_COMMUNITY)
Admission: RE | Admit: 2019-02-02 | Discharge: 2019-02-02 | Disposition: A | Discharge status: Home / Self Care | Payer: Medicaid Other | Source: Ambulatory Visit | Attending: Obstetrics and Gynecology | Admitting: Obstetrics and Gynecology

## 2019-02-02 ENCOUNTER — Ambulatory Visit (INDEPENDENT_AMBULATORY_CARE_PROVIDER_SITE_OTHER): Payer: Self-pay | Admitting: Emergency Medicine

## 2019-02-02 DIAGNOSIS — O3680X0 Pregnancy with inconclusive fetal viability, not applicable or unspecified: Secondary | ICD-10-CM

## 2019-02-02 DIAGNOSIS — Z712 Person consulting for explanation of examination or test findings: Secondary | ICD-10-CM

## 2019-02-02 NOTE — Progress Notes (Signed)
Patient seen and assessed by nursing staff during this encounter. I have reviewed the chart and agree with the documentation and plan.  Courtlynn Holloman DNP, CNM  02/02/19  11:55 AM     

## 2019-02-02 NOTE — Progress Notes (Signed)
Pt here today for u/s results. Results reviewed with Nira Conn, CNM. Pt informed of single intrauterine pregnancy with GA [redacted]w[redacted]d and EDD 09/25/19. Pt provided with u/s pictures. Pt encouraged to start taking prenatal vitamins and start prenatal care. Pt verbalized understanding and had no further questions. Proof of pregnancy letter to be provided by front office.

## 2019-03-17 ENCOUNTER — Ambulatory Visit (HOSPITAL_COMMUNITY)
Admission: EM | Admit: 2019-03-17 | Discharge: 2019-03-17 | Disposition: A | Discharge status: Home / Self Care | Payer: Medicaid Other | Attending: Family Medicine | Admitting: Family Medicine

## 2019-03-17 ENCOUNTER — Encounter (HOSPITAL_COMMUNITY): Payer: Self-pay

## 2019-03-17 ENCOUNTER — Other Ambulatory Visit: Payer: Self-pay

## 2019-03-17 DIAGNOSIS — N898 Other specified noninflammatory disorders of vagina: Secondary | ICD-10-CM | POA: Diagnosis not present

## 2019-03-17 DIAGNOSIS — Z3202 Encounter for pregnancy test, result negative: Secondary | ICD-10-CM | POA: Diagnosis not present

## 2019-03-17 LAB — POCT PREGNANCY, URINE: Preg Test, Ur: NEGATIVE

## 2019-03-17 LAB — POCT URINALYSIS DIP (DEVICE)
Bilirubin Urine: NEGATIVE
Glucose, UA: NEGATIVE mg/dL
Hgb urine dipstick: NEGATIVE
Ketones, ur: NEGATIVE mg/dL
Nitrite: NEGATIVE
Protein, ur: NEGATIVE mg/dL
Specific Gravity, Urine: 1.025 (ref 1.005–1.030)
Urobilinogen, UA: 0.2 mg/dL (ref 0.0–1.0)
pH: 7 (ref 5.0–8.0)

## 2019-03-17 LAB — POC URINE PREG, ED: Preg Test, Ur: NEGATIVE

## 2019-03-17 MED ORDER — METRONIDAZOLE 500 MG PO TABS: 500.0000 mg | ORAL_TABLET | Freq: Two times a day (BID) | ORAL | 0 refills | Status: DC

## 2019-03-17 NOTE — ED Triage Notes (Signed)
Pt presents with vaginal discharge and vaginal odor X 4 days.

## 2019-03-17 NOTE — ED Provider Notes (Signed)
[facility]    CSN: [id number] Arrival date & time: 03/17/19  1257      History   Chief Complaint Chief Complaint  Patient presents with  . Vaginal Discharge    HPI Ann Boyd is a 18 y.o. female.   Pt is a 18 year old female that presents with vaginal discharge and foul odor. Symptoms have been present x 4 days. Treated for gonorrhea and chlamydia back in July. Hx of BV and yeast. Recent  pregnancy and had an abortion. Had bleeding for a few weeks after the abortion but that has stopped. Currently sexually active with one partner, unprotected. No abd pain , back pain, fever, dysuria, hematuria, urinary frequency.   ROS per HPI    Vaginal Discharge   Past Medical History:  Diagnosis Date  . Hx of chlamydia infection 09/2018  . Hx of gonorrhea 09/2018  . Hx of trichomoniasis 09/2017    Patient Active Problem List   Diagnosis Date Noted  . Contraception 11/26/2012  . Encounter for medical examination to establish care 04/10/2011    History reviewed. No pertinent surgical history.  OB History    Gravida  1   Para      Term      Preterm      AB      Living        SAB      TAB      Ectopic      Multiple      Live Births               Home Medications    Prior to Admission medications   Medication Sig Start Date End Date Taking? Authorizing Provider  metroNIDAZOLE (FLAGYL) 500 MG tablet Take 1 tablet (500 mg total) by mouth 2 (two) times daily. 03/17/19   Dahlia Byes A, NP  medroxyPROGESTERone (DEPO-PROVERA) 150 MG/ML injection Inject 1 mL (150 mg total) into the muscle every 3 (three) months. 11/26/12 11/30/18  Dessa Phi, MD    Family History Family History  Problem Relation Age of Onset  . Healthy Mother   . Healthy Father     Social History Social History   Tobacco Use  . Smoking status: Passive Smoke Exposure - Never Smoker  . Smokeless tobacco: Never Used  Substance Use Topics  . Alcohol use: No  .  Drug use: No     Allergies   Patient has no known allergies.   Review of Systems Review of Systems  Genitourinary: Positive for vaginal discharge.     Physical Exam Triage Vital Signs ED Triage Vitals  Enc Vitals Group     BP 03/17/19 1328 111/72     Pulse Rate 03/17/19 1328 79     Resp 03/17/19 1328 18     Temp 03/17/19 1328 98.8 F (37.1 C)     Temp Source 03/17/19 1328 Oral     SpO2 03/17/19 1328 98 %     Weight --      Height --      Head Circumference --      Peak Flow --      Pain Score 03/17/19 1330 0     Pain Loc --      Pain Edu? --      Excl. in GC? --    No data found.  Updated Vital Signs BP 111/72 (BP Location: Left Arm)   Pulse 79   Temp 98.8 F (37.1 C) (Oral)  Resp 18   LMP 02/20/2019   SpO2 98%   Breastfeeding Unknown   Visual Acuity Right Eye Distance:   Left Eye Distance:   Bilateral Distance:    Right Eye Near:   Left Eye Near:    Bilateral Near:     Physical Exam Vitals and nursing note reviewed.  Constitutional:      General: She is not in acute distress.    Appearance: Normal appearance. She is not ill-appearing, toxic-appearing or diaphoretic.  HENT:     Head: Normocephalic.     Nose: Nose normal.     Mouth/Throat:     Pharynx: Oropharynx is clear.  Eyes:     Conjunctiva/sclera: Conjunctivae normal.  Pulmonary:     Effort: Pulmonary effort is normal.  Abdominal:     Palpations: Abdomen is soft.     Tenderness: There is no abdominal tenderness.  Musculoskeletal:        General: Normal range of motion.     Cervical back: Normal range of motion.  Skin:    General: Skin is warm and dry.     Findings: No rash.  Neurological:     Mental Status: She is alert.  Psychiatric:        Mood and Affect: Mood normal.      UC Treatments / Results  Labs (all labs ordered are listed, but only abnormal results are displayed) Labs Reviewed  POCT URINALYSIS DIP (DEVICE) - Abnormal; Notable for the following components:       Result Value   Leukocytes,Ua TRACE (*)    All other components within normal limits  POC URINE PREG, ED  POCT PREGNANCY, URINE  CERVICOVAGINAL ANCILLARY ONLY    EKG   Radiology No results found.  Procedures Procedures (including critical care time)  Medications Ordered in UC Medications - No data to display  Initial Impression / Assessment and Plan / UC Course  I have reviewed the triage vital signs and the nursing notes.  Pertinent labs & imaging results that were available during my care of the patient were reviewed by me and considered in my medical decision making (see chart for details).     Vaginal discharge- treating for BV based on symptoms.  Sending swab for testing.  Pt opting treatment for STDs today.  Urine with trace leuks and no urinary symptoms. Not likely UTI.  Final Clinical Impressions(s) / UC Diagnoses   Final diagnoses:  Vaginal discharge     Discharge Instructions     Treating you for Bacterial Vaginosis.  We will send your swab for testing and call you with any positive results.      ED Prescriptions    Medication Sig Dispense Auth. Provider   metroNIDAZOLE (FLAGYL) 500 MG tablet Take 1 tablet (500 mg total) by mouth 2 (two) times daily. 14 tablet Peyson Postema A, NP     PDMP not reviewed this encounter.   Orvan July, NP 03/17/19 1444

## 2019-03-17 NOTE — Discharge Instructions (Addendum)
Treating you for Bacterial Vaginosis.  We will send your swab for testing and call you with any positive results.

## 2019-03-19 ENCOUNTER — Telehealth (HOSPITAL_COMMUNITY): Payer: Self-pay | Admitting: Emergency Medicine

## 2019-03-19 ENCOUNTER — Ambulatory Visit (HOSPITAL_COMMUNITY)
Admission: EM | Admit: 2019-03-19 | Discharge: 2019-03-19 | Disposition: A | Discharge status: Home / Self Care | Payer: Medicaid Other | Attending: Family Medicine | Admitting: Family Medicine

## 2019-03-19 DIAGNOSIS — A549 Gonococcal infection, unspecified: Secondary | ICD-10-CM

## 2019-03-19 LAB — CERVICOVAGINAL ANCILLARY ONLY
Bacterial vaginitis: POSITIVE — AB
Candida vaginitis: NEGATIVE
Chlamydia: POSITIVE — AB
Neisseria Gonorrhea: POSITIVE — AB
Trichomonas: NEGATIVE

## 2019-03-19 MED ORDER — AZITHROMYCIN 250 MG PO TABS
1000.0000 mg | ORAL_TABLET | Freq: Once | ORAL | Status: AC
  Administered 2019-03-19: 1000 mg via ORAL

## 2019-03-19 MED ORDER — LIDOCAINE HCL 2 % IJ SOLN
INTRAMUSCULAR | Status: AC
  Filled 2019-03-19: qty 20

## 2019-03-19 MED ORDER — AZITHROMYCIN 250 MG PO TABS
ORAL_TABLET | ORAL | Status: AC
  Filled 2019-03-19: qty 4

## 2019-03-19 MED ORDER — CEFTRIAXONE SODIUM 250 MG IJ SOLR
INTRAMUSCULAR | Status: AC
  Filled 2019-03-19: qty 250

## 2019-03-19 MED ORDER — CEFTRIAXONE SODIUM 250 MG IJ SOLR
250.0000 mg | Freq: Once | INTRAMUSCULAR | Status: AC
  Administered 2019-03-19: 16:00:00 250 mg via INTRAMUSCULAR

## 2019-03-19 NOTE — ED Triage Notes (Signed)
Pt presents for STD treatment of chlamydia & gonorrhea after returning test results from 03/17/2019 visit per provider Loura Halt.  Pt was given education and verbalized understanding.

## 2019-03-19 NOTE — Telephone Encounter (Signed)
Gonorrhea and chlamydia are positive.  Patient should return as soon as possible to the urgent care for treatment with IM rocephin 250mg  and po zithromax 1g. Patient will not need to see a provider unless there are new symptoms she would like evaluated. Pt needs education to refrain from sexual intercourse for now and for 7 days after treatment to give the medicine time to work. Sexual partners need to be notified and tested/treated. Condoms may reduce risk of reinfection. [facility] notified.   Bacterial Vaginosis test is positive.  Prescription for metronidazole was given at the urgent care visit.  Patient contacted by phone and made aware of    results. Pt verbalized understanding and had all questions answered.  She will return today for treatment.

## 2019-05-30 ENCOUNTER — Other Ambulatory Visit: Payer: Self-pay

## 2019-05-30 ENCOUNTER — Ambulatory Visit (HOSPITAL_COMMUNITY)
Admission: EM | Admit: 2019-05-30 | Discharge: 2019-05-30 | Disposition: A | Discharge status: Home / Self Care | Payer: Medicaid Other | Attending: Urgent Care | Admitting: Urgent Care

## 2019-05-30 ENCOUNTER — Encounter (HOSPITAL_COMMUNITY): Payer: Self-pay

## 2019-05-30 DIAGNOSIS — N760 Acute vaginitis: Secondary | ICD-10-CM | POA: Diagnosis present

## 2019-05-30 DIAGNOSIS — R102 Pelvic and perineal pain: Secondary | ICD-10-CM | POA: Diagnosis present

## 2019-05-30 DIAGNOSIS — Z3202 Encounter for pregnancy test, result negative: Secondary | ICD-10-CM | POA: Diagnosis not present

## 2019-05-30 DIAGNOSIS — N898 Other specified noninflammatory disorders of vagina: Secondary | ICD-10-CM | POA: Insufficient documentation

## 2019-05-30 LAB — POCT PREGNANCY, URINE: Preg Test, Ur: NEGATIVE

## 2019-05-30 LAB — POC URINE PREG, ED: Preg Test, Ur: NEGATIVE

## 2019-05-30 MED ORDER — NAPROXEN 500 MG PO TABS: 500.0000 mg | ORAL_TABLET | Freq: Two times a day (BID) | ORAL | 0 refills | Status: DC

## 2019-05-30 MED ORDER — FLUCONAZOLE 150 MG PO TABS: 150.0000 mg | ORAL_TABLET | ORAL | 0 refills | Status: DC

## 2019-05-30 NOTE — ED Provider Notes (Signed)
[place]   MRN: [id number] DOB: [birth year]  Subjective:   Ann Boyd is a 19 y.o. female presenting for 2 day history of intermittent pelvic pains, vaginal itching.  Has a history of persistent yeast infections and BV.  States that she has a same-sex partner and that they get tested regularly.  She also tries to make sure that they use condoms consistently.  She has not taken anything for pelvic pain.  Reports that she does not have any at the moment.  LMP was 2 weeks ago, was regular.  She is not on contraception.  Denies fever, nausea, vomiting, dysuria, urinary frequency, genital rash, vaginal discharge.  No current facility-administered medications for this encounter.  Current Outpatient Medications:  .  metroNIDAZOLE (FLAGYL) 500 MG tablet, Take 1 tablet (500 mg total) by mouth 2 (two) times daily., Disp: 14 tablet, Rfl: 0   No Known Allergies  Past Medical History:  Diagnosis Date  . Hx of chlamydia infection 09/2018  . Hx of gonorrhea 09/2018  . Hx of trichomoniasis 09/2017     History reviewed. No pertinent surgical history.  Family History  Problem Relation Age of Onset  . Healthy Mother   . Healthy Father     Social History   Tobacco Use  . Smoking status: Passive Smoke Exposure - Never Smoker  . Smokeless tobacco: Never Used  Substance Use Topics  . Alcohol use: No  . Drug use: No    ROS   Objective:   Vitals: BP 124/77 (BP Location: Right Arm)   Pulse 72   Temp (!) 97.5 F (36.4 C) (Oral)   Resp 16   LMP 05/19/2019   Physical Exam Constitutional:      General: She is not in acute distress.    Appearance: Normal appearance. She is well-developed. She is not ill-appearing.  HENT:     Head: Normocephalic and atraumatic.     Nose: Nose normal.     Mouth/Throat:     Mouth: Mucous membranes are moist.     Pharynx: Oropharynx is clear.  Eyes:     General: No scleral icterus.    Extraocular Movements: Extraocular movements intact.      Pupils: Pupils are equal, round, and reactive to light.  Cardiovascular:     Rate and Rhythm: Normal rate.  Pulmonary:     Effort: Pulmonary effort is normal.  Abdominal:     General: Bowel sounds are normal. There is no distension.     Palpations: Abdomen is soft. There is no mass.     Tenderness: There is no abdominal tenderness. There is no right CVA tenderness, left CVA tenderness, guarding or rebound.  Genitourinary:    Comments: Patient declined pelvic exam. Skin:    General: Skin is warm and dry.  Neurological:     General: No focal deficit present.     Mental Status: She is alert and oriented to person, place, and time.  Psychiatric:        Mood and Affect: Mood normal.        Behavior: Behavior normal.        Thought Content: Thought content normal.        Judgment: Judgment normal.     Results for orders placed or performed during the hospital encounter of 05/30/19 (from the past 24 hour(s))  POC urine pregnancy     Status: None   Collection Time: 05/30/19 11:15 AM  Result Value Ref Range   Preg Test, Ur NEGATIVE  NEGATIVE  Pregnancy, urine POC     Status: None   Collection Time: 05/30/19 11:15 AM  Result Value Ref Range   Preg Test, Ur NEGATIVE NEGATIVE    Assessment and Plan :   1. Vaginal discharge   2. Acute vaginitis   3. Pelvic pain in female     Will cover for yeast vaginitis with Diflucan.  Patient declined pelvic exam.  Labs pending, will treat as appropriate based off of results.  Recommended she abstain from sex in the meantime. Counseled patient on potential for adverse effects with medications prescribed/recommended today, ER and return-to-clinic precautions discussed, patient verbalized understanding.    Wallis Bamberg, [place] 05/30/19 1202

## 2019-05-30 NOTE — ED Triage Notes (Signed)
Pt present vaginal irritation, After having sex on Thursday her vaginal became very irritate with some discharge.

## 2019-06-01 LAB — CERVICOVAGINAL ANCILLARY ONLY
Bacterial vaginitis: NEGATIVE
Candida vaginitis: NEGATIVE
Chlamydia: NEGATIVE
Neisseria Gonorrhea: NEGATIVE
Trichomonas: NEGATIVE

## 2019-08-06 ENCOUNTER — Other Ambulatory Visit: Payer: Self-pay

## 2019-08-06 ENCOUNTER — Ambulatory Visit (INDEPENDENT_AMBULATORY_CARE_PROVIDER_SITE_OTHER): Payer: Medicaid Other

## 2019-08-06 VITALS — BP 120/74 | HR 83 | Ht 65.0 in | Wt 162.0 lb

## 2019-08-06 DIAGNOSIS — Z3201 Encounter for pregnancy test, result positive: Secondary | ICD-10-CM | POA: Diagnosis not present

## 2019-08-06 DIAGNOSIS — Z3490 Encounter for supervision of normal pregnancy, unspecified, unspecified trimester: Secondary | ICD-10-CM | POA: Insufficient documentation

## 2019-08-06 LAB — POCT URINE PREGNANCY: Preg Test, Ur: POSITIVE — AB

## 2019-08-06 NOTE — Progress Notes (Addendum)
     Ms. Schall presents today for UPT. She has no unusual complaints.  LMP:  07/01/2019  [redacted]w[redacted]d EDD: 04/06/2020    OBJECTIVE: Appears well, in no apparent distress.   OB History    Gravida  2   Para      Term      Preterm      AB  1   Living  0     SAB      TAB      Ectopic      Multiple  1   Live Births             Home UPT Result: POSITIVE X 3 In-Office UPT result: POSITIVE I have reviewed the patient's medical, obstetrical, social, and family histories, and medications.   ASSESSMENT: Positive pregnancy test EDD 04/06/2020  PLAN Prenatal care to be completed at: [facility] Pt already have BP Cuff Babyscripts App downloaded Prenatal labs will be done at [facility] visit  Patient was assessed and managed by nursing staff during this encounter. I have reviewed the chart and agree with the documentation and plan. I have also made any necessary editorial changes.  Coral Ceo, MD 08/13/2019 5:06 AM

## 2019-09-10 ENCOUNTER — Other Ambulatory Visit: Payer: Self-pay

## 2019-09-10 ENCOUNTER — Ambulatory Visit (INDEPENDENT_AMBULATORY_CARE_PROVIDER_SITE_OTHER): Payer: Medicaid Other | Admitting: Certified Nurse Midwife

## 2019-09-10 ENCOUNTER — Other Ambulatory Visit (HOSPITAL_COMMUNITY)
Admission: RE | Admit: 2019-09-10 | Discharge: 2019-09-10 | Disposition: A | Discharge status: Home / Self Care | Payer: Medicaid Other | Source: Ambulatory Visit | Attending: Certified Nurse Midwife | Admitting: Certified Nurse Midwife

## 2019-09-10 ENCOUNTER — Encounter: Payer: Self-pay | Admitting: Certified Nurse Midwife

## 2019-09-10 VITALS — BP 118/74 | HR 91 | Temp 98.7°F | Wt 154.0 lb

## 2019-09-10 DIAGNOSIS — Z3A10 10 weeks gestation of pregnancy: Secondary | ICD-10-CM

## 2019-09-10 DIAGNOSIS — Z3481 Encounter for supervision of other normal pregnancy, first trimester: Secondary | ICD-10-CM

## 2019-09-10 DIAGNOSIS — A749 Chlamydial infection, unspecified: Secondary | ICD-10-CM | POA: Diagnosis not present

## 2019-09-10 DIAGNOSIS — O219 Vomiting of pregnancy, unspecified: Secondary | ICD-10-CM

## 2019-09-10 DIAGNOSIS — D509 Iron deficiency anemia, unspecified: Secondary | ICD-10-CM

## 2019-09-10 DIAGNOSIS — Z3490 Encounter for supervision of normal pregnancy, unspecified, unspecified trimester: Secondary | ICD-10-CM | POA: Insufficient documentation

## 2019-09-10 DIAGNOSIS — O98811 Other maternal infectious and parasitic diseases complicating pregnancy, first trimester: Secondary | ICD-10-CM | POA: Diagnosis not present

## 2019-09-10 DIAGNOSIS — O99019 Anemia complicating pregnancy, unspecified trimester: Secondary | ICD-10-CM

## 2019-09-10 DIAGNOSIS — O2341 Unspecified infection of urinary tract in pregnancy, first trimester: Secondary | ICD-10-CM

## 2019-09-10 MED ORDER — ONDANSETRON 4 MG PO TBDP: 4.0000 mg | ORAL_TABLET | Freq: Three times a day (TID) | ORAL | 2 refills | Status: DC | PRN

## 2019-09-10 NOTE — Progress Notes (Signed)
History:   Ann Boyd is a 19 y.o. G2P0010 at [redacted]w[redacted]d by LMP being seen today for her first obstetrical visit. . Patient does intend to breast feed. Pregnancy history fully reviewed.  Patient reports nausea and vomiting.     HISTORY: OB History  Gravida Para Term Preterm AB Living  2 0 0 0 1 0  SAB TAB Ectopic Multiple Live Births  0 0 0 1 0    # Outcome Date GA Lbr Len/2nd Weight Sex Delivery Anes PTL Lv  2 Current           1A Gravida           1B AB 02/12/19             Past Medical History:  Diagnosis Date  . Hx of chlamydia infection 09/2018  . Hx of gonorrhea 09/2018  . Hx of trichomoniasis 09/2017  . Medical history non-contributory    History reviewed. No pertinent surgical history. Family History  Problem Relation Age of Onset  . Healthy Mother   . Hypertension Mother   . Healthy Father   . ADD / ADHD Brother    Social History   Tobacco Use  . Smoking status: Passive Smoke Exposure - Never Smoker  . Smokeless tobacco: Never Used  Vaping Use  . Vaping Use: Never used  Substance Use Topics  . Alcohol use: No  . Drug use: No   No Known Allergies Current Outpatient Medications on File Prior to Visit  Medication Sig Dispense Refill  . fluconazole (DIFLUCAN) 150 MG tablet Take 1 tablet (150 mg total) by mouth once a week. (Patient not taking: Reported on 08/06/2019) 2 tablet 0  . metroNIDAZOLE (FLAGYL) 500 MG tablet Take 1 tablet (500 mg total) by mouth 2 (two) times daily. (Patient not taking: Reported on 08/06/2019) 14 tablet 0  . naproxen (NAPROSYN) 500 MG tablet Take 1 tablet (500 mg total) by mouth 2 (two) times daily. (Patient not taking: Reported on 08/06/2019) 30 tablet 0  . [DISCONTINUED] medroxyPROGESTERone (DEPO-PROVERA) 150 MG/ML injection Inject 1 mL (150 mg total) into the muscle every 3 (three) months. 1 mL    No current facility-administered medications on file prior to visit.    Review of Systems Pertinent items noted in HPI and remainder  of comprehensive ROS otherwise negative. Physical Exam:   Vitals:   09/10/19 1015  BP: 118/74  Pulse: 91  Temp: 98.7 F (37.1 C)  Weight: 154 lb (69.9 kg)   Fetal Heart Rate (bpm): 164  Pelvic Exam: Perineum: no hemorrhoids, normal perineum   Vulva: normal external genitalia, no lesions   Vagina:  normal mucosa, moderate amount of white thick discharge   Cervix: no lesions and normal, pap smear done. Cervix retroverted.    Adnexa: normal adnexa and no mass, fullness, tenderness   Bony Pelvis: average  System: General: well-developed, well-nourished female in no acute distress   Breasts:  normal appearance, no masses or tenderness bilaterally   Skin: normal coloration and turgor, no rashes   Neurologic: oriented, normal, negative, normal mood   Extremities: normal strength, tone, and muscle mass, ROM of all joints is normal   HEENT PERRLA, extraocular movement intact and sclera clear, anicteric   Mouth/Teeth mucous membranes moist, pharynx normal without lesions and dental hygiene good   Neck supple and no masses   Cardiovascular: regular rate and rhythm   Respiratory:  no respiratory distress, normal breath sounds   Abdomen: soft, non-tender; bowel sounds normal;  no masses,  no organomegaly    Assessment:    Pregnancy: G2P0010 Patient Active Problem List   Diagnosis Date Noted  . Supervision of normal pregnancy, antepartum 08/06/2019  . Contraception 11/26/2012  . Encounter for medical examination to establish care 04/10/2011     Plan:    1. Encounter for supervision of normal pregnancy, antepartum, unspecified gravidity - Welcomed to practice and introduced self to patient  - Reviewed safety, visitor policy, reassurance about COVID-19 for pregnancy at this time. Discussed possible changes to visits, including televisits, that may occur due to COVID-19.  The office remains open if pt needs to be seen and MAU is open 24 hours/day for OB emergencies. - Anticipatory  guidance on upcoming appointments  - safe medications during pregnancy discussed  - Enroll Patient in Babyscripts - Cytology - PAP( [place]) - Cervicovaginal ancillary only( [place]) - Genetic Screening - CBC/D/Plt+RPR+Rh+ABO+Rub Ab... - Culture, OB Urine - [place] MFM OB COMP + 14 WK; Future  2. Nausea and vomiting during pregnancy - patient reports morning sickness daily  - ondansetron (ZOFRAN ODT) 4 MG disintegrating tablet; Take 1 tablet (4 mg total) by mouth every 8 (eight) hours as needed for nausea or vomiting.  Dispense: 30 tablet; Refill: 2   Initial labs drawn. Prenatal vitamin samples given today- Vitafol Fe, if patient likes then will send Rx  Problem list reviewed and updated. Genetic Screening discussed, NIPS: ordered. Ultrasound discussed; fetal anatomic survey: ordered. Discussed usage of Babyscripts and virtual visits as additional source of managing and completing prenatal visits in midst of coronavirus and pandemic.   Anticipatory guidance for prenatal visits including labs, ultrasounds, and testing; Initial labs drawn. Encouraged to complete MyChart Registration for her ability to review results, send requests, and have questions addressed.  The nature of  - [facility] with multiple MDs and Advanced Practice Providers was explained to patient; also emphasized that residents, students are part of our team. Routine obstetric precautions reviewed. Encouraged to seek out care at office or emergency room [facility] MAU preferred) for urgent and/or emergent concerns. Return in about 5 weeks (around 10/15/2019) for ROB/AFP.     Sharyon Cable, CNM [facility] [employer], [facility]

## 2019-09-10 NOTE — Progress Notes (Signed)
New OB, c/o NV.

## 2019-09-10 NOTE — Patient Instructions (Signed)
First Trimester of Pregnancy  The first trimester of pregnancy is from week 1 until the end of week 13 (months 1 through 3). During this time, your baby will begin to develop inside you. At 6-8 weeks, the eyes and face are formed, and the heartbeat can be seen on ultrasound. At the end of 12 weeks, all the baby's organs are formed. Prenatal care is all the medical care you receive before the birth of your baby. Make sure you get good prenatal care and follow all of your doctor's instructions. Follow these instructions at home: Medicines  Take over-the-counter and prescription medicines only as told by your doctor. Some medicines are safe and some medicines are not safe during pregnancy.  Take a prenatal vitamin that contains at least 600 micrograms (mcg) of folic acid.  If you have trouble pooping (constipation), take medicine that will make your stool soft (stool softener) if your doctor approves. Eating and drinking   Eat regular, healthy meals.  Your doctor will tell you the amount of weight gain that is right for you.  Avoid raw meat and uncooked cheese.  If you feel sick to your stomach (nauseous) or throw up (vomit): ? Eat 4 or 5 small meals a day instead of 3 large meals. ? Try eating a few soda crackers. ? Drink liquids between meals instead of during meals.  To prevent constipation: ? Eat foods that are high in fiber, like fresh fruits and vegetables, whole grains, and beans. ? Drink enough fluids to keep your pee (urine) clear or pale yellow. Activity  Exercise only as told by your doctor. Stop exercising if you have cramps or pain in your lower belly (abdomen) or low back.  Do not exercise if it is too hot, too humid, or if you are in a place of great height (high altitude).  Try to avoid standing for long periods of time. Move your legs often if you must stand in one place for a long time.  Avoid heavy lifting.  Wear low-heeled shoes. Sit and stand up straight.   You can have sex unless your doctor tells you not to. Relieving pain and discomfort  Wear a good support bra if your breasts are sore.  Take warm water baths (sitz baths) to soothe pain or discomfort caused by hemorrhoids. Use hemorrhoid cream if your doctor says it is okay.  Rest with your legs raised if you have leg cramps or low back pain.  If you have puffy, bulging veins (varicose veins) in your legs: ? Wear support hose or compression stockings as told by your doctor. ? Raise (elevate) your feet for 15 minutes, 3-4 times a day. ? Limit salt in your food. Prenatal care  Schedule your prenatal visits by the twelfth week of pregnancy.  Write down your questions. Take them to your prenatal visits.  Keep all your prenatal visits as told by your doctor. This is important. Safety  Wear your seat belt at all times when driving.  Make a list of emergency phone numbers. The list should include numbers for family, friends, the hospital, and police and fire departments. General instructions  Ask your doctor for a referral to a local prenatal class. Begin classes no later than at the start of month 6 of your pregnancy.  Ask for help if you need counseling or if you need help with nutrition. Your doctor can give you advice or tell you where to go for help.  Do not use hot tubs, steam   rooms, or saunas.  Do not douche or use tampons or scented sanitary pads.  Do not cross your legs for long periods of time.  Avoid all herbs and alcohol. Avoid drugs that are not approved by your doctor.  Do not use any tobacco products, including cigarettes, chewing tobacco, and electronic cigarettes. If you need help quitting, ask your doctor. You may get counseling or other support to help you quit.  Avoid cat litter boxes and soil used by cats. These carry germs that can cause birth defects in the baby and can cause a loss of your baby (miscarriage) or stillbirth.  Visit your dentist. At home,  brush your teeth with a soft toothbrush. Be gentle when you floss. Contact a doctor if:  You are dizzy.  You have mild cramps or pressure in your lower belly.  You have a nagging pain in your belly area.  You continue to feel sick to your stomach, you throw up, or you have watery poop (diarrhea).  You have a bad smelling fluid coming from your vagina.  You have pain when you pee (urinate).  You have increased puffiness (swelling) in your face, hands, legs, or ankles. Get help right away if:  You have a fever.  You are leaking fluid from your vagina.  You have spotting or bleeding from your vagina.  You have very bad belly cramping or pain.  You gain or lose weight rapidly.  You throw up blood. It may look like coffee grounds.  You are around people who have [place] measles, fifth disease, or chickenpox.  You have a very bad headache.  You have shortness of breath.  You have any kind of trauma, such as from a fall or a car accident. Summary  The first trimester of pregnancy is from week 1 until the end of week 13 (months 1 through 3).  To take care of yourself and your unborn baby, you will need to eat healthy meals, take medicines only if your doctor tells you to do so, and do activities that are safe for you and your baby.  Keep all follow-up visits as told by your doctor. This is important as your doctor will have to ensure that your baby is healthy and growing well. This information is not intended to replace advice given to you by your health care provider. Make sure you discuss any questions you have with your health care provider. Document Revised: 07/09/2018 Document Reviewed: 03/26/2016 Elsevier Patient Education  [place] Medications in Pregnancy   Acne: Benzoyl Peroxide Salicylic Acid  Backache/Headache: Tylenol: 2 regular strength every 4 hours OR              2 Extra strength every 6 hours  Colds/Coughs/Allergies: Benadryl  (alcohol free) 25 mg every 6 hours as needed Breath right strips Claritin Cepacol throat lozenges Chloraseptic throat spray Cold-Eeze- up to three times per day Cough drops, alcohol free Flonase (by prescription only) Guaifenesin Mucinex Robitussin DM (plain only, alcohol free) Saline nasal spray/drops Sudafed (pseudoephedrine) & Actifed ** use only after [redacted] weeks gestation and if you do not have high blood pressure Tylenol Vicks Vaporub Zinc lozenges Zyrtec   Constipation: Colace Ducolax suppositories Fleet enema Glycerin suppositories Metamucil Milk of magnesia Miralax Senokot Smooth move tea  Diarrhea: Kaopectate Imodium A-D  *NO pepto Bismol  Hemorrhoids: Anusol Anusol HC Preparation H Tucks  Indigestion: Tums Maalox Mylanta Zantac  Pepcid  Insomnia: Benadryl (alcohol free) 25mg  every 6 hours as needed  Tylenol PM Unisom, no Gelcaps  Leg Cramps: Tums MagGel  Nausea/Vomiting:  Bonine Dramamine Emetrol Ginger extract Sea bands Meclizine  Nausea medication to take during pregnancy:  Unisom (doxylamine succinate 25 mg tablets) Take one tablet daily at bedtime. If symptoms are not adequately controlled, the dose can be increased to a maximum recommended dose of two tablets daily (1/2 tablet in the morning, 1/2 tablet mid-afternoon and one at bedtime). Vitamin B6 100mg  tablets. Take one tablet twice a day (up to 200 mg per day).  Skin Rashes: Aveeno products Benadryl cream or 25mg  every 6 hours as needed Calamine Lotion 1% cortisone cream  Yeast infection: Gyne-lotrimin 7 Monistat 7   **If taking multiple medications, please check labels to avoid duplicating the same active ingredients **take medication as directed on the label ** Do not exceed 4000 mg of tylenol in 24 hours **Do not take medications that contain aspirin or ibuprofen    Morning Sickness  Morning sickness is when you feel sick to your stomach (nauseous) during  pregnancy. You may feel sick to your stomach and throw up (vomit). You may feel sick in the morning, but you can feel this way at any time of day. Some women feel very sick to their stomach and cannot stop throwing up (hyperemesis gravidarum). Follow these instructions at home: Medicines  Take over-the-counter and prescription medicines only as told by your doctor. Do not take any medicines until you talk with your doctor about them first.  Taking multivitamins before getting pregnant can stop or lessen the harshness of morning sickness. Eating and drinking  Eat dry toast or crackers before getting out of bed.  Eat 5 or 6 small meals a day.  Eat dry and bland foods like rice and baked potatoes.  Do not eat greasy, fatty, or spicy foods.  Have someone cook for you if the smell of food causes you to feel sick or throw up.  If you feel sick to your stomach after taking prenatal vitamins, take them at night or with a snack.  Eat protein when you need a snack. Nuts, yogurt, and cheese are good choices.  Drink fluids throughout the day.  Try ginger ale made with real ginger, ginger tea made from fresh grated ginger, or ginger candies. General instructions  Do not use any products that have nicotine or tobacco in them, such as cigarettes and e-cigarettes. If you need help quitting, ask your doctor.  Use an air purifier to keep the air in your house free of smells.  Get lots of fresh air.  Try to avoid smells that make you feel sick.  Try: ? Wearing a bracelet that is used for seasickness (acupressure wristband). ? Going to a doctor who puts thin needles into certain body points (acupuncture) to improve how you feel. Contact a doctor if:  You need medicine to feel better.  You feel dizzy or light-headed.  You are losing weight. Get help right away if:  You feel very sick to your stomach and cannot stop throwing up.  You pass out (faint).  You have very bad pain in your  belly. Summary  Morning sickness is when you feel sick to your stomach (nauseous) during pregnancy.  You may feel sick in the morning, but you can feel this way at any time of day.  Making some changes to what you eat may help your symptoms go away. This information is not intended to replace advice given to you by your health care provider.  Make sure you discuss any questions you have with your health care provider. Document Revised: 02/28/2017 Document Reviewed: 04/18/2016 Elsevier Patient Education  2020 [employer].

## 2019-09-11 LAB — CBC/D/PLT+RPR+RH+ABO+RUB AB...
Antibody Screen: NEGATIVE
Basophils Absolute: 0 10*3/uL (ref 0.0–0.2)
Basos: 0 %
EOS (ABSOLUTE): 0.1 10*3/uL (ref 0.0–0.4)
Eos: 2 %
HCV Ab: <0.1 s/co ratio (ref 0.0–0.9)
HIV Screen 4th Generation wRfx: NONREACTIVE
Hematocrit: 33.6 % — ABNORMAL LOW (ref 34.0–46.6)
Hemoglobin: 10.4 g/dL — ABNORMAL LOW (ref 11.1–15.9)
Hepatitis B Surface Ag: NEGATIVE
Immature Grans (Abs): 0 10*3/uL (ref 0.0–0.1)
Immature Granulocytes: 0 %
Lymphocytes Absolute: 1.7 10*3/uL (ref 0.7–3.1)
Lymphs: 27 %
MCH: 25.7 pg — ABNORMAL LOW (ref 26.6–33.0)
MCHC: 31 g/dL — ABNORMAL LOW (ref 31.5–35.7)
MCV: 83 fL (ref 79–97)
Monocytes Absolute: 0.7 10*3/uL (ref 0.1–0.9)
Monocytes: 12 %
Neutrophils Absolute: 3.8 10*3/uL (ref 1.4–7.0)
Neutrophils: 59 %
Platelets: 234 10*3/uL (ref 150–450)
RBC: 4.05 x10E6/uL (ref 3.77–5.28)
RDW: 16 % — ABNORMAL HIGH (ref 11.7–15.4)
RPR Ser Ql: NONREACTIVE
Rh Factor: POSITIVE
Rubella Antibodies, IGG: 6.61 index (ref >0.99)
WBC: 6.4 10*3/uL (ref 3.4–10.8)

## 2019-09-11 LAB — HCV INTERPRETATION

## 2019-09-12 ENCOUNTER — Inpatient Hospital Stay (HOSPITAL_COMMUNITY)
Admission: AD | Admit: 2019-09-12 | Discharge: 2019-09-12 | Disposition: A | Discharge status: Home / Self Care | Payer: Medicaid Other | Attending: Family Medicine | Admitting: Family Medicine

## 2019-09-12 ENCOUNTER — Encounter (HOSPITAL_COMMUNITY): Payer: Self-pay | Admitting: Family Medicine

## 2019-09-12 ENCOUNTER — Other Ambulatory Visit: Payer: Self-pay

## 2019-09-12 DIAGNOSIS — N898 Other specified noninflammatory disorders of vagina: Secondary | ICD-10-CM

## 2019-09-12 DIAGNOSIS — Z7722 Contact with and (suspected) exposure to environmental tobacco smoke (acute) (chronic): Secondary | ICD-10-CM | POA: Insufficient documentation

## 2019-09-12 DIAGNOSIS — Z8619 Personal history of other infectious and parasitic diseases: Secondary | ICD-10-CM | POA: Diagnosis not present

## 2019-09-12 DIAGNOSIS — O98811 Other maternal infectious and parasitic diseases complicating pregnancy, first trimester: Secondary | ICD-10-CM | POA: Diagnosis not present

## 2019-09-12 DIAGNOSIS — Z3A10 10 weeks gestation of pregnancy: Secondary | ICD-10-CM | POA: Diagnosis not present

## 2019-09-12 DIAGNOSIS — R109 Unspecified abdominal pain: Secondary | ICD-10-CM | POA: Diagnosis present

## 2019-09-12 DIAGNOSIS — O26891 Other specified pregnancy related conditions, first trimester: Secondary | ICD-10-CM

## 2019-09-12 LAB — URINALYSIS, ROUTINE W REFLEX MICROSCOPIC
Bilirubin Urine: NEGATIVE
Glucose, UA: NEGATIVE mg/dL
Hgb urine dipstick: NEGATIVE
Ketones, ur: NEGATIVE mg/dL
Leukocytes,Ua: NEGATIVE
Nitrite: NEGATIVE
Protein, ur: NEGATIVE mg/dL
Specific Gravity, Urine: 1.023 (ref 1.005–1.030)
pH: 6 (ref 5.0–8.0)

## 2019-09-12 LAB — WET PREP, GENITAL
Clue Cells Wet Prep HPF POC: NONE SEEN
Sperm: NONE SEEN
Trich, Wet Prep: NONE SEEN
Yeast Wet Prep HPF POC: NONE SEEN

## 2019-09-12 NOTE — Discharge Instructions (Signed)

## 2019-09-12 NOTE — MAU Provider Note (Signed)
History     CSN: [id number]  Arrival date and time: 09/12/19 7026   First Provider Initiated Contact with Patient 09/12/19 2111      Chief Complaint  Patient presents with  . Vaginal Discharge  . Abdominal Pain   HPI Ann Boyd is a 19 y.o. G2P0010 at [redacted]w[redacted]d who presents with vaginal discharge and cramping. She states she spent all day at the pool today. After coming home from the pool, she reports a thick stringy discharge that she tried to pull and was painful. She reports she is now having intermittent cramping that she rates a 3/10. She denies any bleeding.   OB History    Gravida  2   Para      Term      Preterm      AB  1   Living  0     SAB      TAB      Ectopic      Multiple  0   Live Births              Past Medical History:  Diagnosis Date  . Hx of chlamydia infection 09/2018  . Hx of gonorrhea 09/2018  . Hx of trichomoniasis 09/2017  . Medical history non-contributory     Past Surgical History:  Procedure Laterality Date  . NO PAST SURGERIES      Family History  Problem Relation Age of Onset  . Healthy Mother   . Hypertension Mother   . Healthy Father   . ADD / ADHD Brother     Social History   Tobacco Use  . Smoking status: Passive Smoke Exposure - Never Smoker  . Smokeless tobacco: Never Used  Vaping Use  . Vaping Use: Never used  Substance Use Topics  . Alcohol use: No  . Drug use: No    Allergies: No Known Allergies  Medications Prior to Admission  Medication Sig Dispense Refill Last Dose  . ondansetron (ZOFRAN ODT) 4 MG disintegrating tablet Take 1 tablet (4 mg total) by mouth every 8 (eight) hours as needed for nausea or vomiting. 30 tablet 2 09/11/2019 at Unknown time  . fluconazole (DIFLUCAN) 150 MG tablet Take 1 tablet (150 mg total) by mouth once a week. (Patient not taking: Reported on 08/06/2019) 2 tablet 0   . metroNIDAZOLE (FLAGYL) 500 MG tablet Take 1 tablet (500 mg total) by mouth 2 (two) times daily.  (Patient not taking: Reported on 08/06/2019) 14 tablet 0   . naproxen (NAPROSYN) 500 MG tablet Take 1 tablet (500 mg total) by mouth 2 (two) times daily. (Patient not taking: Reported on 08/06/2019) 30 tablet 0     Review of Systems  Constitutional: Negative.  Negative for fatigue and fever.  HENT: Negative.   Respiratory: Negative.  Negative for shortness of breath.   Cardiovascular: Negative.  Negative for chest pain.  Gastrointestinal: Positive for abdominal pain. Negative for constipation, diarrhea, nausea and vomiting.  Genitourinary: Positive for vaginal discharge. Negative for dysuria.  Neurological: Negative.  Negative for dizziness and headaches.   Physical Exam   Blood pressure 120/69, pulse 72, temperature 98.5 F (36.9 C), resp. rate 18, height 5\' 5"  (1.651 m), weight 71.3 kg, last menstrual period 07/01/2019, SpO2 100 %, unknown if currently breastfeeding.  Physical Exam  Nursing note and vitals reviewed. Constitutional: She is oriented to person, place, and time. She appears well-developed. No distress.  HENT:  Head: Normocephalic.  Eyes: Pupils are equal, round, and reactive  to light.  Cardiovascular: Normal rate, regular rhythm and normal heart sounds.  Respiratory: Effort normal and breath sounds normal. No respiratory distress.  GI: Soft. Bowel sounds are normal. She exhibits no distension. There is no abdominal tenderness.  Genitourinary:    Genitourinary Comments: SSE: small amount of mucous discharge   Neurological: She is alert and oriented to person, place, and time.  Skin: Skin is warm and dry.  Psychiatric: Her behavior is normal. Judgment and thought content normal.    Cervix: closed/thick/posterior  FHT: 168bpm  MAU Course  Procedures Results for orders placed or performed during the hospital encounter of 09/12/19 (from the past 24 hour(s))  Wet prep, genital     Status: Abnormal   Collection Time: 09/12/19  9:01 PM   Specimen: PATH Cytology  Cervicovaginal Ancillary Only  Result Value Ref Range   Yeast Wet Prep HPF POC NONE SEEN NONE SEEN   Trich, Wet Prep NONE SEEN NONE SEEN   Clue Cells Wet Prep HPF POC NONE SEEN NONE SEEN   WBC, Wet Prep HPF POC MANY (A) NONE SEEN   Sperm NONE SEEN   Urinalysis, Routine w reflex microscopic     Status: Abnormal   Collection Time: 09/12/19  9:01 PM  Result Value Ref Range   Color, Urine YELLOW YELLOW   APPearance HAZY (A) CLEAR   Specific Gravity, Urine 1.023 1.005 - 1.030   pH 6.0 5.0 - 8.0   Glucose, UA NEGATIVE NEGATIVE mg/dL   Hgb urine dipstick NEGATIVE NEGATIVE   Bilirubin Urine NEGATIVE NEGATIVE   Ketones, ur NEGATIVE NEGATIVE mg/dL   Protein, ur NEGATIVE NEGATIVE mg/dL   Nitrite NEGATIVE NEGATIVE   Leukocytes,Ua NEGATIVE NEGATIVE   MDM UA Wet prep and gc/chlamydia  Assessment and Plan   1. Vaginal discharge during pregnancy in first trimester   2. [redacted] weeks gestation of pregnancy    -Discharge home in stable condition -First trimester precautions discussed -Patient advised to follow-up with [facility] as scheduled for prenatal care -Patient may return to MAU as needed or if her condition were to change or worsen   Rolm Bookbinder CNM 09/12/2019, 9:11 PM

## 2019-09-12 NOTE — MAU Note (Signed)
Pt at pool today. Started having abdominal pain tonight. Noticed some white, "stringy" discharge. Tried to pull it out and it was "uncomfortable". No itching or odor. Was seen at at doctor on Friday and had a pap smear. Had a small amount of spotting yesterday but none today.

## 2019-09-13 DIAGNOSIS — A749 Chlamydial infection, unspecified: Secondary | ICD-10-CM | POA: Insufficient documentation

## 2019-09-13 DIAGNOSIS — D509 Iron deficiency anemia, unspecified: Secondary | ICD-10-CM | POA: Insufficient documentation

## 2019-09-13 DIAGNOSIS — O2341 Unspecified infection of urinary tract in pregnancy, first trimester: Secondary | ICD-10-CM | POA: Insufficient documentation

## 2019-09-13 DIAGNOSIS — O99019 Anemia complicating pregnancy, unspecified trimester: Secondary | ICD-10-CM | POA: Insufficient documentation

## 2019-09-13 DIAGNOSIS — O98811 Other maternal infectious and parasitic diseases complicating pregnancy, first trimester: Secondary | ICD-10-CM | POA: Insufficient documentation

## 2019-09-13 LAB — CYTOLOGY - PAP: Diagnosis: NEGATIVE

## 2019-09-13 LAB — CERVICOVAGINAL ANCILLARY ONLY
Bacterial Vaginitis (gardnerella): NEGATIVE
Candida Glabrata: NEGATIVE
Candida Vaginitis: NEGATIVE
Chlamydia: POSITIVE — AB
Comment: NEGATIVE
Comment: NEGATIVE
Comment: NEGATIVE
Comment: NEGATIVE
Comment: NEGATIVE
Comment: NORMAL
Neisseria Gonorrhea: NEGATIVE
Trichomonas: NEGATIVE

## 2019-09-13 LAB — URINE CULTURE, OB REFLEX

## 2019-09-13 LAB — CULTURE, OB URINE

## 2019-09-13 MED ORDER — FERROUS GLUCONATE 324 (38 FE) MG PO TABS: 324.0000 mg | ORAL_TABLET | Freq: Every day | ORAL | 3 refills | Status: DC

## 2019-09-13 MED ORDER — VITAFOL FE+ 90-1-200 & 50 MG PO CPPK: 1.0000 | ORAL_CAPSULE | Freq: Every day | ORAL | 5 refills | Status: DC

## 2019-09-13 MED ORDER — CEFADROXIL 500 MG PO CAPS: 500.0000 mg | ORAL_CAPSULE | Freq: Two times a day (BID) | ORAL | 0 refills | Status: DC

## 2019-09-13 MED ORDER — AZITHROMYCIN 500 MG PO TABS: 1000.0000 mg | ORAL_TABLET | Freq: Once | ORAL | 1 refills | Status: AC

## 2019-09-13 NOTE — Addendum Note (Signed)
Addended by: Sharyon Cable on: 09/13/2019 05:21 PM   Modules accepted: Orders

## 2019-09-14 LAB — GC/CHLAMYDIA PROBE AMP (~~LOC~~) NOT AT ARMC
Chlamydia: POSITIVE — AB
Comment: NEGATIVE
Comment: NORMAL
Neisseria Gonorrhea: NEGATIVE

## 2019-09-20 ENCOUNTER — Encounter: Payer: Self-pay | Admitting: Certified Nurse Midwife

## 2019-09-22 ENCOUNTER — Encounter: Payer: Self-pay | Admitting: Certified Nurse Midwife

## 2019-10-08 ENCOUNTER — Other Ambulatory Visit (HOSPITAL_COMMUNITY)
Admission: RE | Admit: 2019-10-08 | Discharge: 2019-10-08 | Disposition: A | Discharge status: Home / Self Care | Payer: Medicaid Other | Source: Ambulatory Visit | Attending: Women's Health | Admitting: Women's Health

## 2019-10-08 ENCOUNTER — Other Ambulatory Visit: Payer: Self-pay

## 2019-10-08 ENCOUNTER — Encounter: Payer: Self-pay | Admitting: Women's Health

## 2019-10-08 ENCOUNTER — Ambulatory Visit (INDEPENDENT_AMBULATORY_CARE_PROVIDER_SITE_OTHER): Payer: Medicaid Other | Admitting: Women's Health

## 2019-10-08 VITALS — BP 112/67 | HR 77 | Wt 153.0 lb

## 2019-10-08 DIAGNOSIS — A749 Chlamydial infection, unspecified: Secondary | ICD-10-CM | POA: Insufficient documentation

## 2019-10-08 DIAGNOSIS — Z3A14 14 weeks gestation of pregnancy: Secondary | ICD-10-CM

## 2019-10-08 DIAGNOSIS — O98812 Other maternal infectious and parasitic diseases complicating pregnancy, second trimester: Secondary | ICD-10-CM

## 2019-10-08 DIAGNOSIS — O2341 Unspecified infection of urinary tract in pregnancy, first trimester: Secondary | ICD-10-CM

## 2019-10-08 DIAGNOSIS — O98811 Other maternal infectious and parasitic diseases complicating pregnancy, first trimester: Secondary | ICD-10-CM | POA: Diagnosis not present

## 2019-10-08 DIAGNOSIS — O2342 Unspecified infection of urinary tract in pregnancy, second trimester: Secondary | ICD-10-CM

## 2019-10-08 DIAGNOSIS — D509 Iron deficiency anemia, unspecified: Secondary | ICD-10-CM

## 2019-10-08 DIAGNOSIS — O99012 Anemia complicating pregnancy, second trimester: Secondary | ICD-10-CM

## 2019-10-08 DIAGNOSIS — Z3480 Encounter for supervision of other normal pregnancy, unspecified trimester: Secondary | ICD-10-CM

## 2019-10-08 NOTE — Patient Instructions (Addendum)
Maternity Assessment Unit (MAU)  The Maternity Assessment Unit (MAU) is located at the [facility]. The address is: [address], [address], [place], [place] [address]. Please see map below for additional directions.    The Maternity Assessment Unit is designed to help you during your pregnancy, and for up to 6 weeks after delivery, with any pregnancy- or postpartum-related emergencies, if you think you are in labor, or if your water has broken. For example, if you experience nausea and vomiting, vaginal bleeding, severe abdominal or pelvic pain, elevated blood pressure or other problems related to your pregnancy or postpartum time, please come to the Maternity Assessment Unit for assistance.        Preterm Labor and Birth Information  The normal length of a pregnancy is 39-41 weeks. Preterm labor is when labor starts before 37 completed weeks of pregnancy. What are the risk factors for preterm labor? Preterm labor is more likely to occur in women who:  Have certain infections during pregnancy such as a bladder infection, sexually transmitted infection, or infection inside the uterus (chorioamnionitis).  Have a shorter-than-normal cervix.  Have gone into preterm labor before.  Have had surgery on their cervix.  Are younger than age 17 or older than age 35.  Are African American.  Are pregnant with twins or multiple babies (multiple gestation).  Take street drugs or smoke while pregnant.  Do not gain enough weight while pregnant.  Became pregnant shortly after having been pregnant. What are the symptoms of preterm labor? Symptoms of preterm labor include:  Cramps similar to those that can happen during a menstrual period. The cramps may happen with diarrhea.  Pain in the abdomen or lower back.  Regular uterine contractions that may feel like tightening of the abdomen.  A feeling of increased pressure in the  pelvis.  Increased watery or bloody mucus discharge from the vagina.  Water breaking (ruptured amniotic sac). Why is it important to recognize signs of preterm labor? It is important to recognize signs of preterm labor because babies who are born prematurely may not be fully developed. This can put them at an increased risk for:  Long-term (chronic) heart and lung problems.  Difficulty immediately after birth with regulating body systems, including blood sugar, body temperature, heart rate, and breathing rate.  Bleeding in the brain.  Cerebral palsy.  Learning difficulties.  Death. These risks are highest for babies who are born before 34 weeks of pregnancy. How is preterm labor treated? Treatment depends on the length of your pregnancy, your condition, and the health of your baby. It may involve:  Having a stitch (suture) placed in your cervix to prevent your cervix from opening too early (cerclage).  Taking or being given medicines, such as: ? Hormone medicines. These may be given early in pregnancy to help support the pregnancy. ? Medicine to stop contractions. ? Medicines to help mature the baby's lungs. These may be prescribed if the risk of delivery is high. ? Medicines to prevent your baby from developing cerebral palsy. If the labor happens before 34 weeks of pregnancy, you may need to stay in the hospital. What should I do if I think I am in preterm labor? If you think that you are going into preterm labor, call your health care provider right away. How can I prevent preterm labor in future pregnancies? To increase your chance of having a full-term pregnancy:  Do not use any tobacco products, such as   chewing tobacco, and e-cigarettes. If you need help quitting, ask your health care provider.  Do not use street drugs or medicines that have not been prescribed to you during your pregnancy.  Talk with your health care provider before taking any herbal  supplements, even if you have been taking them regularly.  Make sure you gain a healthy amount of weight during your pregnancy.  Watch for infection. If you think that you might have an infection, get it checked right away.  Make sure to tell your health care provider if you have gone into preterm labor before. This information is not intended to replace advice given to you by your health care provider. Make sure you discuss any questions you have with your health care provider. Document Revised: 07/10/2018 Document Reviewed: 08/09/2015 Elsevier Patient Education  2020 [employer].                       Safe Medications in Pregnancy    Acne: Benzoyl Peroxide Salicylic Acid  Backache/Headache: Tylenol: 2 regular strength every 4 hours OR              2 Extra strength every 6 hours  Colds/Coughs/Allergies: Benadryl (alcohol free) 25 mg every 6 hours as needed Breath right strips Claritin Cepacol throat lozenges Chloraseptic throat spray Cold-Eeze- up to three times per day Cough drops, alcohol free Flonase (by prescription only) Guaifenesin Mucinex Robitussin DM (plain only, alcohol free) Saline nasal spray/drops Sudafed (pseudoephedrine) & Actifed ** use only after [redacted] weeks gestation and if you do not have high blood pressure Tylenol Vicks Vaporub Zinc lozenges Zyrtec   Constipation: Colace Ducolax suppositories Fleet enema Glycerin suppositories Metamucil Milk of magnesia Miralax Senokot Smooth move tea  Diarrhea: Kaopectate Imodium A-D  *NO pepto Bismol  Hemorrhoids: Anusol Anusol HC Preparation H Tucks  Indigestion: Tums Maalox Mylanta Zantac  Pepcid  Insomnia: Benadryl (alcohol free) 25mg  every 6 hours as needed Tylenol PM Unisom, no Gelcaps  Leg Cramps: Tums MagGel  Nausea/Vomiting:  Bonine Dramamine Emetrol Ginger extract Sea bands Meclizine  Nausea medication to take during pregnancy:  Unisom (doxylamine succinate 25  mg tablets) Take one tablet daily at bedtime. If symptoms are not adequately controlled, the dose can be increased to a maximum recommended dose of two tablets daily (1/2 tablet in the morning, 1/2 tablet mid-afternoon and one at bedtime). Vitamin B6 100mg  tablets. Take one tablet twice a day (up to 200 mg per day).  Skin Rashes: Aveeno products Benadryl cream or 25mg  every 6 hours as needed Calamine Lotion 1% cortisone cream  Yeast infection: Gyne-lotrimin 7 Monistat 7   **If taking multiple medications, please check labels to avoid duplicating the same active ingredients **take medication as directed on the label ** Do not exceed 4000 mg of tylenol in 24 hours **Do not take medications that contain aspirin or ibuprofen         Preventing Sexually Transmitted Infections, Adult Sexually transmitted infections (STIs) are diseases that are passed (transmitted) from person to person through bodily fluids exchanged during sex or sexual contact. Bodily fluids include saliva, semen, blood, vaginal mucus, and urine. You may have an increased risk for developing an STI if you have unprotected oral, vaginal, or anal sex. Some common STIs include:  Herpes.  Hepatitis B.  Chlamydia.  Gonorrhea.  Syphilis.  HPV (human papillomavirus).  HIV (human immunodeficiency virus), the virus that can cause AIDS (acquired immunodeficiency syndrome). How can I protect myself from sexually transmitted  infections? The only way to completely prevent STIs is not to have sex of any kind (practice abstinence). This includes oral, vaginal, or anal sex. If you are sexually active, take these actions to lower your risk of getting an STI:  Have only one sex partner (be monogamous) or limit the number of sexual partners you have.  Stay up-to-date on immunizations. Certain vaccines can lower your risk of getting certain STIs, such as: ? Hepatitis A and B vaccines. You may have been vaccinated as a young  child, but likely need a booster shot as a teen or young adult. ? HPV vaccine.  Use methods that prevent the exchange of body fluids between partners (barrier protection) every time you have sex. Barrier protection can be used during oral, vaginal, or anal sex. Commonly used barrier methods include: ? Female condom. ? Female condom. ? Dental dam.  Get tested regularly for STIs. Have your sexual partner get tested regularly as well.  Avoid mixing alcohol, drugs, and sex. Alcohol and drug use can affect your ability to make good decisions and can lead to risky sexual behaviors.  Ask your health care provider about taking pre-exposure prophylaxis (PrEP) to prevent HIV infection if you: ? Have a HIV-positive sexual partner. ? Have multiple sexual partners or partners who do not know their HIV status, and do not regularly use a condom during sex. ? Use injection drugs and share needles. Birth control pills, injections, implants, and intrauterine devices (IUDs) do not protect against STIs. To prevent both STIs and pregnancy, always use a condom with another form of birth control. Some STIs, such as herpes, are spread through skin to skin contact. A condom does not protect you from getting such STIs. If you or your partner have herpes and there is an active flare with open sores, avoid all sexual contact. Why are these changes important? Taking steps to practice safe sex protects you and others. Many STIs can be cured. However, some STIs are not curable and will affect you for the rest of your life. STIs can be passed on to another person even if you do not have symptoms. What can happen if changes are not made? Certain STIs may:  Require you to take medicine for the rest of your life.  Affect your ability to have children (your fertility).  Increase your risk for developing another STI or certain serious health conditions, such as: ? Cervical cancer. ? Head and neck cancer. ? Pelvic  inflammatory disease (PID) in women. ? Organ damage or damage to other parts of your body, if the infection spreads.  Be passed to a baby during childbirth. How are sexually transmitted infections treated? If you or your partner know or think that you may have an STI:  Talk with your health care provider about what can be done to treat it. Some STIs can be treated and cured with medicines.  For curable STIs, you and your partner should avoid sex during treatment and for several days after treatment is complete.  You and your partner should both be treated at the same time, if there is any chance that your partner is infected as well. If you get treatment but your partner does not, your partner can re-infect you when you resume sexual contact.  Do not have unprotected sex. Where to find more information Learn more about sexually transmitted diseases and infections from:  Centers for Disease Control and Prevention: ? More information about specific STIs: SolutionApps.co.za ? Find places to  get sexual health counseling and treatment for free or for a low cost: gettested.TonerPromos.nocdc.gov  U.S. Department of Health and Human Services: NotebookPreviews.siwww.womenshealth.gov/publications/our-publications/fact-sheet/sexually-transmitted-infections.html Summary  The only way to completely prevent STIs is not to have sex (practice abstinence), including oral, vaginal, or anal sex.  STIs can spread through saliva, semen, blood, vaginal mucus, urine, or sexual contact.  If you do have sex, limit your number of sexual partners and use a barrier protection method every time you have sex.  If you develop an STI, get treated right away and ask your partner to be treated as well. Do not resume having sex until both of you have completed treatment for the STI. This information is not intended to replace advice given to you by your health care provider. Make sure you discuss any questions you have with your health care  provider. Document Revised: 08/11/2018 Document Reviewed: 03/14/2016 Elsevier Patient Education  2020 Elsevier Inc.       AREA PEDIATRIC/FAMILY PRACTICE PHYSICIANS  [facility] [address] [place], [place] [address] Phone - [phone]   Fax - [phone]  JACK AMOS [address] [place], [place]  [address] Phone - [phone]   Fax - [phone]  [facility] [address] [place], [place]  [address] Phone - [phone]   Fax - [phone]  [facility] [address] [place], [place]  [address] Phone - [phone]   Fax - [phone]  [facility] [address], [address] [place], [place]  [address] Phone - [phone]   Fax - [phone]  [facility] [address], [address] [place], [place]  [address] Phone - [phone]   Fax - [phone]  [facility] [address], [address] [place], [place]  [address] Phone - [phone]   Fax - [phone]  [facility] [address] [place], [place]  [address] Phone - [phone]   Fax - [phone]  [facility] [address] [place], [place]  [address] Phone - [phone]   Fax - [phone] [facility] [address] [place], [place]  [address] Phone - [phone]   Fax - [phone]  [facility] [address] [place], [place]  [address] Phone - [phone]   Fax - [phone]  [facility] [address], [address] [place], [place]  [address] Phone - [phone]   Fax - [phone]  [facility] [address] [place], [place]  [address] Phone - [phone]   Fax - [phone]  [facility] [address], [address] [place] [place], [place]  [address] Phone - [phone]  [facility] [address] [place], [place]  [address] Phone - [phone]   Fax - [phone]  [facility] [address], [address] [place], [place]  [address] Phone - [phone]   Fax - [phone]  [facility] [address] [place], [place]  [address] Phone - [phone]   Fax - [phone]  [facility] [address] [place], [place]  [address] Phone - [phone]   Fax - [phone]   [facility] [address], [address] [place], [place]  [address] Phone - [phone]   Fax - [phone]  [facility] [address], [address] [place], [place]  [address] Phone - [phone]   Fax -  [phone]  DAVID RUBIN [address] [place], [place]  [address] Phone - [phone]   Fax - [phone]  [facility] [address] [place], [place]  [address] Phone - [phone]   Fax - [phone]  [place] [facility] Alita Chyle [address] [place], [place]  [address] Phone - [phone]   Fax - [phone] Gerarda Fraction [address] [place], [place]  [address] Phone - [phone]   Fax - [phone]  [facility] [address] [place], [place]  [address] Phone - [phone]   Fax - [phone]  [facility] [place] [address] [place], [place]  [address] Phone - [phone]   Fax - [phone]          Alpha-Fetoprotein Test Why am I having this test? The alpha-fetoprotein test is most commonly used in pregnant women to help screen for birth defects in their unborn baby. It can be used to screen for birth defects, such as chromosome (DNA) abnormalities, problems with the brain or spinal cord, or problems with the abdominal wall of the unborn baby (fetus). The alpha-fetoprotein test may also be done for men or non-pregnant women to check for certain cancers. What is being  tested? This test measures the amount of alpha-fetoprotein (AFP) in your blood. AFP is a protein that is made by the liver. Levels can be detected in the mother's blood during pregnancy, starting at 10 weeks and peaking at 16-18 weeks of the pregnancy. Abnormal levels can sometimes be a sign of a birth defect in the baby. Certain cancers can cause a high level of AFP in men and non-pregnant women. What kind of sample is taken?  A blood sample is required for this test. It is usually collected by inserting a needle into a blood vessel. How are the results reported? Your test results will be reported as values. Your health care provider will compare your results to normal ranges that were established after testing a large group of people (reference values). Reference values may vary among labs and hospitals. For this test, common reference values are:  Adult: Less than 40 ng/mL or less than 40 mcg/L (SI units).  Child younger than 1 year: Less than 30 ng/mL. If you are pregnant, the values may also vary based on how long you have been pregnant. What do the results mean? Results that are above the reference values in pregnant women may indicate the following for the baby:  Neural tube defects, such as abnormalities of the spinal cord or brain.  Abdominal wall defects.  Multiple pregnancy such as twins.  Fetal distress or fetal death. Results that are above the reference values in men or non-pregnant women may indicate:  Reproductive cancers, such as ovarian or testicular cancer.  Liver cancer.  Liver cell death.  Other types of cancer. Very low levels of AFP in pregnant women may indicate the following for the baby:  Down syndrome.  Fetal death. Talk with your health care provider about what your results mean. Questions to ask your health care provider Ask your health care provider, or the department that is doing the test:  When will my results be ready?  How will I get my  results?  What are my treatment options?  What other tests do I need?  What are my next steps? Summary  The alpha-fetoprotein test is done on pregnant women to help screen for birth defects in their  unborn baby.  Certain cancers can cause a high level of AFP in men and non-pregnant women.  For this test, a blood sample is usually collected by inserting a needle into a blood vessel.  Talk with your health care provider about what your results mean. This information is not intended to replace advice given to you by your health care provider. Make sure you discuss any questions you have with your health care provider. Document Revised: 02/28/2017 Document Reviewed: 10/22/2016 Elsevier Patient Education  2020 [employer].

## 2019-10-08 NOTE — Progress Notes (Signed)
Patient present for ROB   Per notes needs TOC urine Cx.  CC: Lower Stomach pain, pt notes constipation. Not having a regular BM.

## 2019-10-08 NOTE — Progress Notes (Signed)
Subjective:  Ann Boyd is a 19 y.o. G2P0010 at [redacted]w[redacted]d being seen today for ongoing prenatal care.  She is currently monitored for the following issues for this low-risk pregnancy and has Encounter for medical examination to establish care; Contraception; Supervision of normal pregnancy, antepartum; Urinary tract infection in mother during first trimester of pregnancy; Chlamydia infection affecting pregnancy in first trimester; and Iron deficiency anemia of pregnancy on their problem list.  Patient reports occasional constipation, pt denies abdominal pain at this time.  Contractions: Not present. Vag. Bleeding: None.   . Denies leaking of fluid.   The following portions of the patient's history were reviewed and updated as appropriate: allergies, current medications, past family history, past medical history, past social history, past surgical history and problem list. Problem list updated.  Objective:   Vitals:   10/08/19 1114  BP: 112/67  Pulse: 77  Weight: 153 lb (69.4 kg)    Fetal Status: Fetal Heart Rate (bpm): 158         General:  Alert, oriented and cooperative. Patient is in no acute distress.  Skin: Skin is warm and dry. No rash noted.   Cardiovascular: Normal heart rate noted  Respiratory: Normal respiratory effort, no problems with respiration noted  Abdomen: Soft, gravid, appropriate for gestational age. Pain/Pressure: Absent     Pelvic: Vag. Bleeding: None     Cervical exam deferred        Extremities: Normal range of motion.  Edema: Trace  Mental Status: Normal mood and affect. Normal behavior. Normal judgment and thought content.   Urinalysis:      Assessment and Plan:  Pregnancy: G2P0010 at [redacted]w[redacted]d  1. Supervision of other normal pregnancy, antepartum -AFP next visit -c/o constipation, reports can control with diet and only happens occasionally; pt encouraged to continue with consistent dietary changes, list of safe meds in pregnancy also given with focus on  constipation -peds list given  2. Urinary tract infection in mother during first trimester of pregnancy - Culture, OB Urine  3. Chlamydia infection affecting pregnancy in first trimester - Cervicovaginal ancillary only( [place])  4. Iron deficiency anemia of pregnancy - on oral iron, discussed constipation as side effect  Preterm labor symptoms and general obstetric precautions including but not limited to vaginal bleeding, contractions, leaking of fluid and fetal movement were reviewed in detail with the patient. Please refer to After Visit Summary for other counseling recommendations.  Return in about 4 weeks (around 11/05/2019) for in-person/LOB/APP OK/AFP.   Noelle Hoogland, Odie Sera, NP

## 2019-10-10 LAB — CULTURE, OB URINE

## 2019-10-10 LAB — URINE CULTURE, OB REFLEX: Organism ID, Bacteria: NO GROWTH

## 2019-10-11 LAB — CERVICOVAGINAL ANCILLARY ONLY
Chlamydia: NEGATIVE
Comment: NEGATIVE
Comment: NEGATIVE
Comment: NORMAL
Neisseria Gonorrhea: NEGATIVE
Trichomonas: NEGATIVE

## 2019-10-26 ENCOUNTER — Ambulatory Visit (HOSPITAL_COMMUNITY): Payer: Self-pay

## 2019-11-05 ENCOUNTER — Ambulatory Visit (INDEPENDENT_AMBULATORY_CARE_PROVIDER_SITE_OTHER): Payer: Medicaid Other | Admitting: Obstetrics and Gynecology

## 2019-11-05 ENCOUNTER — Other Ambulatory Visit: Payer: Self-pay

## 2019-11-05 VITALS — BP 120/74 | HR 84 | Wt 153.7 lb

## 2019-11-05 DIAGNOSIS — Z3480 Encounter for supervision of other normal pregnancy, unspecified trimester: Secondary | ICD-10-CM

## 2019-11-05 DIAGNOSIS — Z3A18 18 weeks gestation of pregnancy: Secondary | ICD-10-CM

## 2019-11-05 NOTE — Progress Notes (Signed)
   PRENATAL VISIT NOTE  Subjective:  Ann Boyd is a 19 y.o. G2P0010 at [redacted]w[redacted]d being seen today for ongoing prenatal care.  She is currently monitored for the following issues for this low-risk pregnancy and has Encounter for medical examination to establish care; Contraception; Supervision of normal pregnancy, antepartum; Urinary tract infection in mother during first trimester of pregnancy; Chlamydia infection affecting pregnancy in first trimester; Iron deficiency anemia of pregnancy; and [redacted] weeks gestation of pregnancy on their problem list.  Patient doing well with no acute concerns today. She reports no complaints.  Contractions: Not present.  .  Movement: Present. Denies leaking of fluid.   The following portions of the patient's history were reviewed and updated as appropriate: allergies, current medications, past family history, past medical history, past social history, past surgical history and problem list. Problem list updated.  Objective:   Vitals:   11/05/19 1053  BP: 120/74  Pulse: 84  Weight: 153 lb 11.2 oz (69.7 kg)    Fetal Status: Fetal Heart Rate (bpm): 152   Movement: Present     General:  Alert, oriented and cooperative. Patient is in no acute distress.  Skin: Skin is warm and dry. No rash noted.   Cardiovascular: Normal heart rate noted  Respiratory: Normal respiratory effort, no problems with respiration noted  Abdomen: Soft, gravid, appropriate for gestational age.  Pain/Pressure: Present     Pelvic: Cervical exam deferred        Extremities: Normal range of motion.  Edema: Trace  Mental Status:  Normal mood and affect. Normal behavior. Normal judgment and thought content.   Assessment and Plan:  Pregnancy: G2P0010 at [redacted]w[redacted]d  1. Supervision of other normal pregnancy, antepartum Discussed limited weight gain, pt notes occasional mild nausea in the morning, but the rest of the day she eats normally, will monitor  AFP today  2. [redacted] weeks gestation of  pregnancy   Preterm labor symptoms and general obstetric precautions including but not limited to vaginal bleeding, contractions, leaking of fluid and fetal movement were reviewed in detail with the patient.  Please refer to After Visit Summary for other counseling recommendations.   Return in about 4 weeks (around 12/03/2019) for ROB, in person.   Mariel Aloe, MD

## 2019-11-05 NOTE — Addendum Note (Signed)
Addended by: Cheree Ditto, Luv Mish A on: 11/05/2019 11:17 AM   Modules accepted: Orders

## 2019-11-07 LAB — AFP, SERUM, OPEN SPINA BIFIDA
AFP MoM: 1.21
AFP Value: 58.8 ng/mL
Gest. Age on Collection Date: 18 weeks
Maternal Age At EDD: 21.6 yr
OSBR Risk 1 IN: 10000
Test Results:: NEGATIVE
Weight: 153 [lb_av]

## 2019-11-11 ENCOUNTER — Ambulatory Visit: Payer: Medicaid Other | Attending: Obstetrics and Gynecology

## 2019-11-11 ENCOUNTER — Other Ambulatory Visit: Payer: Self-pay

## 2019-11-11 ENCOUNTER — Other Ambulatory Visit: Payer: Self-pay | Admitting: Certified Nurse Midwife

## 2019-11-11 DIAGNOSIS — Z3490 Encounter for supervision of normal pregnancy, unspecified, unspecified trimester: Secondary | ICD-10-CM | POA: Diagnosis present

## 2019-11-11 DIAGNOSIS — Z363 Encounter for antenatal screening for malformations: Secondary | ICD-10-CM | POA: Diagnosis not present

## 2019-11-11 DIAGNOSIS — O35BXX0 Maternal care for other (suspected) fetal abnormality and damage, fetal cardiac anomalies, not applicable or unspecified: Secondary | ICD-10-CM

## 2019-11-11 DIAGNOSIS — Z3A19 19 weeks gestation of pregnancy: Secondary | ICD-10-CM

## 2019-11-11 DIAGNOSIS — O358XX0 Maternal care for other (suspected) fetal abnormality and damage, not applicable or unspecified: Secondary | ICD-10-CM | POA: Insufficient documentation

## 2019-11-14 ENCOUNTER — Encounter (HOSPITAL_COMMUNITY): Payer: Self-pay

## 2019-11-14 ENCOUNTER — Inpatient Hospital Stay (HOSPITAL_COMMUNITY)
Admission: AD | Admit: 2019-11-14 | Discharge: 2019-11-14 | Discharge status: Against Medical Advice | Payer: Medicaid Other | Attending: Family Medicine | Admitting: Family Medicine

## 2019-11-14 ENCOUNTER — Ambulatory Visit (HOSPITAL_COMMUNITY)
Admission: EM | Admit: 2019-11-14 | Discharge: 2019-11-14 | Disposition: A | Discharge status: Home / Self Care | Payer: Medicaid Other | Attending: Family Medicine | Admitting: Family Medicine

## 2019-11-14 ENCOUNTER — Other Ambulatory Visit: Payer: Self-pay

## 2019-11-14 ENCOUNTER — Encounter (HOSPITAL_COMMUNITY): Payer: Self-pay | Admitting: Family Medicine

## 2019-11-14 DIAGNOSIS — Z3492 Encounter for supervision of normal pregnancy, unspecified, second trimester: Secondary | ICD-10-CM | POA: Diagnosis present

## 2019-11-14 DIAGNOSIS — R519 Headache, unspecified: Secondary | ICD-10-CM | POA: Insufficient documentation

## 2019-11-14 DIAGNOSIS — R079 Chest pain, unspecified: Secondary | ICD-10-CM | POA: Insufficient documentation

## 2019-11-14 DIAGNOSIS — O26892 Other specified pregnancy related conditions, second trimester: Secondary | ICD-10-CM | POA: Insufficient documentation

## 2019-11-14 DIAGNOSIS — Z3A19 19 weeks gestation of pregnancy: Secondary | ICD-10-CM | POA: Insufficient documentation

## 2019-11-14 DIAGNOSIS — R0789 Other chest pain: Secondary | ICD-10-CM

## 2019-11-14 DIAGNOSIS — O99891 Other specified diseases and conditions complicating pregnancy: Secondary | ICD-10-CM

## 2019-11-14 DIAGNOSIS — Z20822 Contact with and (suspected) exposure to covid-19: Secondary | ICD-10-CM | POA: Diagnosis present

## 2019-11-14 LAB — CBC
HCT: 30.2 % — ABNORMAL LOW (ref 36.0–46.0)
Hemoglobin: 9.6 g/dL — ABNORMAL LOW (ref 12.0–15.0)
MCH: 27.4 pg (ref 26.0–34.0)
MCHC: 31.8 g/dL (ref 30.0–36.0)
MCV: 86 fL (ref 80.0–100.0)
Platelets: 241 10*3/uL (ref 150–400)
RBC: 3.51 MIL/uL — ABNORMAL LOW (ref 3.87–5.11)
RDW: 14.5 % (ref 11.5–15.5)
WBC: 8.1 10*3/uL (ref 4.0–10.5)
nRBC: 0 % (ref 0.0–0.2)

## 2019-11-14 LAB — COMPREHENSIVE METABOLIC PANEL
ALT: 14 U/L (ref 0–44)
AST: 16 U/L (ref 15–41)
Albumin: 3.3 g/dL — ABNORMAL LOW (ref 3.5–5.0)
Alkaline Phosphatase: 57 U/L (ref 38–126)
Anion gap: 10 (ref 5–15)
BUN: 6 mg/dL (ref 6–20)
CO2: 20 mmol/L — ABNORMAL LOW (ref 22–32)
Calcium: 9.2 mg/dL (ref 8.9–10.3)
Chloride: 106 mmol/L (ref 98–111)
Creatinine, Ser: 0.49 mg/dL (ref 0.44–1.00)
GFR calc Af Amer: >60 mL/min (ref >60)
GFR calc non Af Amer: >60 mL/min (ref >60)
Glucose, Bld: 85 mg/dL (ref 70–99)
Potassium: 3.4 mmol/L — ABNORMAL LOW (ref 3.5–5.1)
Sodium: 136 mmol/L (ref 135–145)
Total Bilirubin: 0.2 mg/dL — ABNORMAL LOW (ref 0.3–1.2)
Total Protein: 6.8 g/dL (ref 6.5–8.1)

## 2019-11-14 LAB — URINALYSIS, ROUTINE W REFLEX MICROSCOPIC
Bilirubin Urine: NEGATIVE
Glucose, UA: NEGATIVE mg/dL
Hgb urine dipstick: NEGATIVE
Ketones, ur: NEGATIVE mg/dL
Nitrite: NEGATIVE
Protein, ur: NEGATIVE mg/dL
Specific Gravity, Urine: 1.018 (ref 1.005–1.030)
pH: 6 (ref 5.0–8.0)

## 2019-11-14 LAB — TROPONIN I (HIGH SENSITIVITY): Troponin I (High Sensitivity): <2 ng/L (ref <18)

## 2019-11-14 LAB — LIPASE, BLOOD: Lipase: 33 U/L (ref 11–51)

## 2019-11-14 MED ORDER — HYDROCODONE-ACETAMINOPHEN 5-325 MG PO TABS: 1.0000 | ORAL_TABLET | Freq: Four times a day (QID) | ORAL | 0 refills | Status: DC | PRN

## 2019-11-14 NOTE — ED Triage Notes (Signed)
Sent by [facility]U for eval of CP.

## 2019-11-14 NOTE — MAU Provider Note (Signed)
First Provider Initiated Contact with Patient 11/14/19 0027      S Ms. Ann Boyd is a 19 y.o. G2P0010 pregnant female at 19.3 weeks who presents to [facility]U today with complaint of chest pain. She states her pain started about 2 hours ago and has been a constant stabbing sensation.  Patient states the pain is worsened with deep breaths and has no known relieving factors.  Patient denies vaginal concerns and has not yet experienced fetal movement.  She also denies abdominal pain or contractions.  Patient reports taking tylenol XR at 730pm for chest pain stating "it was hurting, but not really bad."  Patient clarifies that the pain has worsened in the past 2 hours, but reports it has been present since waking up from a nap around 730pm.    O BP 120/75 (BP Location: Right Arm)   Pulse 66   Temp 98.2 F (36.8 C) (Oral)   Resp 19   Ht 5\' 5"  (1.651 m)   Wt 69.4 kg   LMP 07/01/2019 (Exact Date)   SpO2 100%   BMI 25.46 kg/m  Physical Exam Constitutional:      Appearance: She is well-developed.  HENT:     Head: Normocephalic and atraumatic.  Cardiovascular:     Rate and Rhythm: Normal rate and regular rhythm.     Heart sounds: Normal heart sounds.  Pulmonary:     Effort: Pulmonary effort is normal.     Breath sounds: Normal breath sounds. No decreased breath sounds.  Abdominal:     Palpations: Abdomen is soft.  Skin:    General: Skin is warm and dry.  Neurological:     Mental Status: She is alert and oriented to person, place, and time.  Psychiatric:        Mood and Affect: Mood normal.        Speech: Speech normal.        Behavior: Behavior normal.    A 19 year old female SIUP at 19.3 weeks  Chest Pain Medical screening exam complete  P Patient stable and informed that transfer appropriate with c/o of chest pain.  Patient and mother verbalizes understanding.  Patient accepts transfer to [facility]. [facility] provider contacted and Sammy, PA given patient information and status.   Accepts transfer. Nurse informed of POC. Patient may return to [facility]U as needed for pregnancy related complaints  [facility], Gerrit Heck 11/14/2019 12:27 AM

## 2019-11-14 NOTE — MAU Note (Signed)
Pt reports chest pain, indicates mid sternum, for 2 hours states pain is stabbing. Denies cough, fever, or other symptoms. Some nausea yesterday. Also reports headache x 2 hours

## 2019-11-14 NOTE — ED Provider Notes (Signed)
[facility]    CSN: [id number] Arrival date & time: 11/14/19  1228      History   Chief Complaint Chief Complaint  Patient presents with  . Chest Pain    HPI Ann Boyd is a 19 y.o. female.   HPI  Patient is [redacted] weeks pregnant Went to the emergency room but left after several hours because of the wait time Lab work and EKG from the emergency room are reviewed She states that she was exposed to Covid She is here now for midsternal chest pain.  Hurts with arm movement.  Hurts with deep breath.  She is also having some dizziness and headache.  She states it started last night. No shortness of breath, cough or congestion in the chest.  No runny stuffy nose or sore throat.  No change in taste or smell  Past Medical History:  Diagnosis Date  . Hx of chlamydia infection 09/2018  . Hx of gonorrhea 09/2018  . Hx of trichomoniasis 09/2017  . Medical history non-contributory     Patient Active Problem List   Diagnosis Date Noted  . [redacted] weeks gestation of pregnancy 11/05/2019  . Urinary tract infection in mother during first trimester of pregnancy 09/13/2019  . Chlamydia infection affecting pregnancy in first trimester 09/13/2019  . Iron deficiency anemia of pregnancy 09/13/2019  . Supervision of normal pregnancy, antepartum 08/06/2019  . Contraception 11/26/2012  . Encounter for medical examination to establish care 04/10/2011    Past Surgical History:  Procedure Laterality Date  . NO PAST SURGERIES      OB History    Gravida  2   Para      Term      Preterm      AB  1   Living  0     SAB      TAB      Ectopic      Multiple  0   Live Births               Home Medications    Prior to Admission medications   Medication Sig Start Date End Date Taking? Authorizing Provider  ferrous gluconate (FERGON) 324 MG tablet Take 1 tablet (324 mg total) by mouth daily with breakfast. 09/13/19   Sharyon Cable, CNM  HYDROcodone-acetaminophen  (NORCO/VICODIN) 5-325 MG tablet Take 1-2 tablets by mouth every 6 (six) hours as needed. 11/14/19   Eustace Moore, MD  Prenat-FePoly-Metf-FA-DHA-DSS (VITAFOL FE+) 90-1-200 & 50 MG CPPK Take 1 tablet by mouth daily. 09/13/19   Sharyon Cable, CNM  medroxyPROGESTERone (DEPO-PROVERA) 150 MG/ML injection Inject 1 mL (150 mg total) into the muscle every 3 (three) months. 11/26/12 11/30/18  Dessa Phi, MD    Family History Family History  Problem Relation Age of Onset  . Healthy Mother   . Hypertension Mother   . Healthy Father   . ADD / ADHD Brother     Social History Social History   Tobacco Use  . Smoking status: Never Smoker  . Smokeless tobacco: Never Used  Vaping Use  . Vaping Use: Never used  Substance Use Topics  . Alcohol use: No  . Drug use: No     Allergies   Patient has no known allergies.   Review of Systems Review of Systems See HPI Physical Exam Triage Vital Signs ED Triage Vitals  Enc Vitals Group     BP 11/14/19 1308 118/66     Pulse Rate 11/14/19 1308 78  Resp 11/14/19 1308 16     Temp 11/14/19 1308 98.3 F (36.8 C)     Temp Source 11/14/19 1308 Oral     SpO2 11/14/19 1308 100 %     Weight 11/14/19 1310 153 lb (69.4 kg)     Height 11/14/19 1310 5\' 5"  (1.651 m)     Head Circumference --      Peak Flow --      Pain Score 11/14/19 1310 5     Pain Loc --      Pain Edu? --      Excl. in GC? --    No data found.  Updated Vital Signs BP 118/66   Pulse 78   Temp 98.3 F (36.8 C) (Oral)   Resp 16   Ht 5\' 5"  (1.651 m)   Wt 69.4 kg   LMP 07/01/2019 (Exact Date)   SpO2 100%   BMI 25.46 kg/m      Physical Exam Constitutional:      General: She is not in acute distress.    Appearance: She is well-developed and normal weight. She is ill-appearing.     Comments: Appears tired  HENT:     Head: Normocephalic and atraumatic.     Nose: No congestion or rhinorrhea.     Mouth/Throat:     Mouth: Mucous membranes are moist.      Pharynx: No posterior oropharyngeal erythema.  Eyes:     Conjunctiva/sclera: Conjunctivae normal.     Pupils: Pupils are equal, round, and reactive to light.  Cardiovascular:     Rate and Rhythm: Normal rate and regular rhythm.     Heart sounds: Normal heart sounds.  Pulmonary:     Effort: Pulmonary effort is normal. No respiratory distress.     Breath sounds: Normal breath sounds.     Comments: Lungs are clear.  Mid sternal region acutely tender to even light pressure Chest:     Chest wall: Tenderness present.  Abdominal:     General: Abdomen is flat. There is no distension.     Palpations: Abdomen is soft.     Comments: Gravid abdomen.  Uterus palpable just below umbilicus  Musculoskeletal:        General: Normal range of motion.     Cervical back: Normal range of motion.  Lymphadenopathy:     Cervical: No cervical adenopathy.  Skin:    General: Skin is warm and dry.  Neurological:     Mental Status: She is alert.  Psychiatric:        Behavior: Behavior normal.      UC Treatments / Results  Labs (all labs ordered are listed, but only abnormal results are displayed) Labs Reviewed  SARS CORONAVIRUS 2 (TAT 6-24 HRS)    EKG normal rate and rhythm.  First-degree AV block.  No ST or T wave changes.   Radiology No results found.  Procedures Procedures (including critical care time)  Medications Ordered in UC Medications - No data to display  Initial Impression / Assessment and Plan / UC Course  I have reviewed the triage vital signs and the nursing notes.  Pertinent labs & imaging results that were available during my care of the patient were reviewed by me and considered in my medical decision making (see chart for details).     Chest wall pain.  Uncertain etiology.  Exposure to Covid.  Covid test is pending.  GYN follow-up is recommended. Patient took Tylenol and this did not alleviate her pain.  We will give her a limited number of Tylenol with hydrocodone to  take sparingly.  Discussed with patient Final Clinical Impressions(s) / UC Diagnoses   Final diagnoses:  Pain, chest wall  Bad headache  Exposure to COVID-19 virus  Pregnant and not yet delivered in second trimester     Discharge Instructions     Home to rest Take tylenol for pain If this is not adequate may add a hydrocodone pain medication Do not drive on hydrocodone Call your GYN tomorrow Quarantine at home until the covid test is available Check My Chart for test results   ED Prescriptions    Medication Sig Dispense Auth. Provider   HYDROcodone-acetaminophen (NORCO/VICODIN) 5-325 MG tablet Take 1-2 tablets by mouth every 6 (six) hours as needed. 10 tablet Eustace Moore, MD     I have reviewed the PDMP during this encounter.   Eustace Moore, MD 11/14/19 [phone]

## 2019-11-14 NOTE — ED Notes (Signed)
Pt left with visitor due to wait time

## 2019-11-14 NOTE — Discharge Instructions (Addendum)
Home to rest Take tylenol for pain If this is not adequate may add a hydrocodone pain medication Do not drive on hydrocodone Call your GYN tomorrow Quarantine at home until the covid test is available Check My Chart for test results

## 2019-11-14 NOTE — ED Notes (Signed)
covid sample placed in lab, labeled, liquid in tube and lid secure

## 2019-11-14 NOTE — ED Triage Notes (Signed)
Pt c/o 5/10 sharp non radiating midsternal chest pain, dizziness and HA started last night. Pt has non labored breathing.

## 2019-11-15 ENCOUNTER — Telehealth: Payer: Self-pay

## 2019-11-15 LAB — SARS CORONAVIRUS 2 (TAT 6-24 HRS): SARS Coronavirus 2: NEGATIVE

## 2019-11-15 NOTE — Telephone Encounter (Signed)
Patient called because she was told by the providers in the ED where she was seen yesterday to follow up with [place]. Pt denies any SOB, she reports that the pain she was having has subsided with the medication she was prescribed from the ED. Pt reports good fetal movement. I advised pt that we can move her OB appt up sooner than 9/2 so we can follow up with her for evaluation, pt voices understanding. Message sent to schedulers.

## 2019-11-22 ENCOUNTER — Encounter: Payer: Self-pay | Admitting: Obstetrics

## 2019-11-22 ENCOUNTER — Ambulatory Visit (INDEPENDENT_AMBULATORY_CARE_PROVIDER_SITE_OTHER): Payer: Medicaid Other | Admitting: Obstetrics

## 2019-11-22 ENCOUNTER — Other Ambulatory Visit: Payer: Self-pay

## 2019-11-22 VITALS — BP 119/77 | HR 96 | Wt 153.6 lb

## 2019-11-22 DIAGNOSIS — Z3A20 20 weeks gestation of pregnancy: Secondary | ICD-10-CM

## 2019-11-22 DIAGNOSIS — Z3480 Encounter for supervision of other normal pregnancy, unspecified trimester: Secondary | ICD-10-CM

## 2019-11-22 DIAGNOSIS — O99891 Other specified diseases and conditions complicating pregnancy: Secondary | ICD-10-CM

## 2019-11-22 DIAGNOSIS — M549 Dorsalgia, unspecified: Secondary | ICD-10-CM

## 2019-11-22 MED ORDER — COMFORT FIT MATERNITY SUPP SM MISC: 0 refills | Status: DC

## 2019-11-22 NOTE — Progress Notes (Addendum)
Subjective:  Ann Boyd is a 19 y.o. G2P0010 at [redacted]w[redacted]d being seen today for ongoing prenatal care.  She is currently monitored for the following issues for this low-risk pregnancy and has Encounter for medical examination to establish care; Contraception; Supervision of normal pregnancy, antepartum; Urinary tract infection in mother during first trimester of pregnancy; Chlamydia infection affecting pregnancy in first trimester; Iron deficiency anemia of pregnancy; and [redacted] weeks gestation of pregnancy on their problem list.  Patient reports backache getting progressively worse after 8-12 hours at work.  Contractions: Not present. Vag. Bleeding: None.  Movement: Present. Denies leaking of fluid.   The following portions of the patient's history were reviewed and updated as appropriate: allergies, current medications, past family history, past medical history, past social history, past surgical history and problem list. Problem list updated.  Objective:   Vitals:   11/22/19 1456  BP: 119/77  Pulse: 96  Weight: 153 lb 9.6 oz (69.7 kg)    Fetal Status:     Movement: Present     General:  Alert, oriented and cooperative. Patient is in no acute distress.  Skin: Skin is warm and dry. No rash noted.   Cardiovascular: Normal heart rate noted  Respiratory: Normal respiratory effort, no problems with respiration noted  Abdomen: Soft, gravid, appropriate for gestational age. Pain/Pressure: Absent     Pelvic:  Cervical exam deferred        Extremities: Normal range of motion.  Edema: Trace  Mental Status: Normal mood and affect. Normal behavior. Normal judgment and thought content.   Urinalysis:      Assessment and Plan:  Pregnancy: G2P0010 at [redacted]w[redacted]d  1. Supervision of other normal pregnancy, antepartum  2. Backache symptom - requesting a reduction of hours at work to 4 hours per shift - Elastic Bandages [employer] Supports (COMFORT FIT MATERNITY SUPP SM) MISC; Wear as directed.  Dispense: 1 each;  Refill: 0   Preterm labor symptoms and general obstetric precautions including but not limited to vaginal bleeding, contractions, leaking of fluid and fetal movement were reviewed in detail with the patient. Please refer to After Visit Summary for other counseling recommendations.   Return in about 4 weeks (around 12/20/2019) for MyChart.   Brock Bad, MD  11/22/19

## 2019-12-02 ENCOUNTER — Encounter: Payer: Medicaid Other | Admitting: Obstetrics and Gynecology

## 2019-12-02 ENCOUNTER — Other Ambulatory Visit: Payer: Self-pay | Admitting: Certified Nurse Midwife

## 2019-12-02 DIAGNOSIS — D509 Iron deficiency anemia, unspecified: Secondary | ICD-10-CM

## 2019-12-20 ENCOUNTER — Telehealth (INDEPENDENT_AMBULATORY_CARE_PROVIDER_SITE_OTHER): Payer: Medicaid Other | Admitting: Women's Health

## 2019-12-20 DIAGNOSIS — O98811 Other maternal infectious and parasitic diseases complicating pregnancy, first trimester: Secondary | ICD-10-CM

## 2019-12-20 DIAGNOSIS — O99019 Anemia complicating pregnancy, unspecified trimester: Secondary | ICD-10-CM

## 2019-12-20 DIAGNOSIS — O99012 Anemia complicating pregnancy, second trimester: Secondary | ICD-10-CM

## 2019-12-20 DIAGNOSIS — O2342 Unspecified infection of urinary tract in pregnancy, second trimester: Secondary | ICD-10-CM

## 2019-12-20 DIAGNOSIS — Z3A24 24 weeks gestation of pregnancy: Secondary | ICD-10-CM

## 2019-12-20 DIAGNOSIS — Z3480 Encounter for supervision of other normal pregnancy, unspecified trimester: Secondary | ICD-10-CM

## 2019-12-20 DIAGNOSIS — O98812 Other maternal infectious and parasitic diseases complicating pregnancy, second trimester: Secondary | ICD-10-CM

## 2019-12-20 DIAGNOSIS — D509 Iron deficiency anemia, unspecified: Secondary | ICD-10-CM

## 2019-12-20 DIAGNOSIS — A749 Chlamydial infection, unspecified: Secondary | ICD-10-CM

## 2019-12-20 DIAGNOSIS — O2341 Unspecified infection of urinary tract in pregnancy, first trimester: Secondary | ICD-10-CM

## 2019-12-20 NOTE — Progress Notes (Signed)
I connected with Ann Boyd 12/20/19 at 11:15 AM EDT by: MyChart video and verified that I am speaking with the correct person using two identifiers.  Patient is located at home and provider is located at [facility].     The purpose of this virtual visit is to provide medical care while limiting exposure to the novel coronavirus. I discussed the limitations, risks, security and privacy concerns of performing an evaluation and management service by MyChart video and the availability of in person appointments. I also discussed with the patient that there may be a patient responsible charge related to this service. By engaging in this virtual visit, you consent to the provision of healthcare.  Additionally, you authorize for your insurance to be billed for the services provided during this visit.  The patient expressed understanding and agreed to proceed.  The following staff members participated in the virtual visit:  Donia Ast    PRENATAL VISIT NOTE  Subjective:  Ann Boyd is a 19 y.o. G2P0010 at [redacted]w[redacted]d  for phone visit for ongoing prenatal care.  She is currently monitored for the following issues for this low-risk pregnancy and has Supervision of normal pregnancy, antepartum; Urinary tract infection in mother during first trimester of pregnancy; Chlamydia infection affecting pregnancy in first trimester; and Iron deficiency anemia of pregnancy on their problem list.  Patient reports no complaints.  Contractions: Not present. Vag. Bleeding: None.  Movement: Present. Denies leaking of fluid.   The following portions of the patient's history were reviewed and updated as appropriate: allergies, current medications, past family history, past medical history, past social history, past surgical history and problem list.   Objective:  There were no vitals filed for this visit. [place]. Patient is heading home this afternoon and will take BP when  she arrives and send results to [place] via MyChart message.  Fetal Status:     Movement: Present     Assessment and Plan:  Pregnancy: G2P0010 at [redacted]w[redacted]d 1. Supervision of other normal pregnancy, antepartum -anticipatory guidance given on upcoming visits -GTT/labs next visit, pt aware to be fasting  2. Urinary tract infection in mother during first trimester of pregnancy Tx 6/14- needs TOC in 4 weeks  TOC neg 10/08/2019  3. Chlamydia infection affecting pregnancy in first trimester Tx 6/14- needs TOC in 4 weeks - neg 10/08/2019  4. Iron deficiency anemia of pregnancy Pt on oral iron  Preterm labor symptoms and general obstetric precautions including but not limited to vaginal bleeding, contractions, leaking of fluid and fetal movement were reviewed in detail with the patient.  Return in about 3 weeks (around 01/10/2020) for in-person LOB/APP OK/GTT/labs, needs nurse visit sooner to check for yeast.  No future appointments.  Time spent on virtual visit: 6 minutes  Marylen Ponto, NP

## 2019-12-20 NOTE — Progress Notes (Signed)
S/w pt for virtual visit, pt reports fetal movement denies pain, pt does not have BP cuff today.

## 2019-12-20 NOTE — Patient Instructions (Addendum)
Maternity Assessment Unit (MAU)  The Maternity Assessment Unit (MAU) is located at the [facility] and [place] at [facility]. The address is: [address] [place], [place], [place] [address]. Please see map below for additional directions.    The Maternity Assessment Unit is designed to help you during your pregnancy, and for up to 6 weeks after delivery, with any pregnancy- or postpartum-related emergencies, if you think you are in labor, or if your water has broken. For example, if you experience nausea and vomiting, vaginal bleeding, severe abdominal or pelvic pain, elevated blood pressure or other problems related to your pregnancy or postpartum time, please come to the Maternity Assessment Unit for assistance.        Preterm Labor and Birth Information  The normal length of a pregnancy is 39-41 weeks. Preterm labor is when labor starts before 37 completed weeks of pregnancy. What are the risk factors for preterm labor? Preterm labor is more likely to occur in women who:  Have certain infections during pregnancy such as a bladder infection, sexually transmitted infection, or infection inside the uterus (chorioamnionitis).  Have a shorter-than-normal cervix.  Have gone into preterm labor before.  Have had surgery on their cervix.  Are younger than age 64 or older than age 26.  Are African American.  Are pregnant with twins or multiple babies (multiple gestation).  Take street drugs or smoke while pregnant.  Do not gain enough weight while pregnant.  Became pregnant shortly after having been pregnant. What are the symptoms of preterm labor? Symptoms of preterm labor include:  Cramps similar to those that can happen during a menstrual period. The cramps may happen with diarrhea.  Pain in the abdomen or lower back.  Regular uterine contractions that may feel like tightening of the abdomen.  A feeling of increased pressure in the  pelvis.  Increased watery or bloody mucus discharge from the vagina.  Water breaking (ruptured amniotic sac). Why is it important to recognize signs of preterm labor? It is important to recognize signs of preterm labor because babies who are born prematurely may not be fully developed. This can put them at an increased risk for:  Long-term (chronic) heart and lung problems.  Difficulty immediately after birth with regulating body systems, including blood sugar, body temperature, heart rate, and breathing rate.  Bleeding in the brain.  Cerebral palsy.  Learning difficulties.  Death. These risks are highest for babies who are born before 38 weeks of pregnancy. How is preterm labor treated? Treatment depends on the length of your pregnancy, your condition, and the health of your baby. It may involve:  Having a stitch (suture) placed in your cervix to prevent your cervix from opening too early (cerclage).  Taking or being given medicines, such as: ? Hormone medicines. These may be given early in pregnancy to help support the pregnancy. ? Medicine to stop contractions. ? Medicines to help mature the baby's lungs. These may be prescribed if the risk of delivery is high. ? Medicines to prevent your baby from developing cerebral palsy. If the labor happens before 34 weeks of pregnancy, you may need to stay in the hospital. What should I do if I think I am in preterm labor? If you think that you are going into preterm labor, call your health care provider right away. How can I prevent preterm labor in future pregnancies? To increase your chance of having a full-term pregnancy:  Do not use any tobacco products, such as  cigarettes, chewing tobacco, and e-cigarettes. If you need help quitting, ask your health care provider.  Do not use street drugs or medicines that have not been prescribed to you during your pregnancy.  Talk with your health care provider before taking any herbal  supplements, even if you have been taking them regularly.  Make sure you gain a healthy amount of weight during your pregnancy.  Watch for infection. If you think that you might have an infection, get it checked right away.  Make sure to tell your health care provider if you have gone into preterm labor before. This information is not intended to replace advice given to you by your health care provider. Make sure you discuss any questions you have with your health care provider. Document Revised: 07/10/2018 Document Reviewed: 08/09/2015 Elsevier Patient Education  [place].        Glucose Tolerance Test During Pregnancy Why am I having this test? The glucose tolerance test (GTT) is done to check how your body processes sugar (glucose). This is one of several tests used to diagnose diabetes that develops during pregnancy (gestational diabetes mellitus). Gestational diabetes is a temporary form of diabetes that some women develop during pregnancy. It usually occurs during the second trimester of pregnancy and goes away after delivery. Testing (screening) for gestational diabetes usually occurs between 24 and 28 weeks of pregnancy. You may have the GTT test after having a 1-hour glucose screening test if the results from that test indicate that you may have gestational diabetes. You may also have this test if:  You have a history of gestational diabetes.  You have a history of giving birth to very large babies or have experienced repeated fetal loss (stillbirth).  You have signs and symptoms of diabetes, such as: ? Changes in your vision. ? Tingling or numbness in your hands or feet. ? Changes in hunger, thirst, and urination that are not otherwise explained by your pregnancy. What is being tested? This test measures the amount of glucose in your blood at different times during a period of 3 hours. This indicates how well your body is able to process glucose. What kind of  sample is taken?  Blood samples are required for this test. They are usually collected by inserting a needle into a blood vessel. How do I prepare for this test?  For 3 days before your test, eat normally. Have plenty of carbohydrate-rich foods.  Follow instructions from your health care provider about: ? Eating or drinking restrictions on the day of the test. You may be asked to not eat or drink anything other than water (fast) starting 8-10 hours before the test. ? Changing or stopping your regular medicines. Some medicines may interfere with this test. Tell a health care provider about:  All medicines you are taking, including vitamins, herbs, eye drops, creams, and over-the-counter medicines.  Any blood disorders you have.  Any surgeries you have had.  Any medical conditions you have. What happens during the test? First, your blood glucose will be measured. This is referred to as your fasting blood glucose, since you fasted before the test. Then, you will drink a glucose solution that contains a certain amount of glucose. Your blood glucose will be measured again 1, 2, and 3 hours after drinking the solution. This test takes about 3 hours to complete. You will need to stay at the testing location during this time. During the testing period:  Do not eat or drink anything other than  the glucose solution.  Do not exercise.  Do not use any products that contain nicotine or tobacco, such as cigarettes and e-cigarettes. If you need help stopping, ask your health care provider. The testing procedure may vary among health care providers and hospitals. How are the results reported? Your results will be reported as milligrams of glucose per deciliter of blood (mg/dL) or millimoles per liter (mmol/L). Your health care provider will compare your results to normal ranges that were established after testing a large group of people (reference ranges). Reference ranges may vary among labs and  hospitals. For this test, common reference ranges are:  Fasting: less than 95-105 mg/dL (5.3-5.8 mmol/L).  1 hour after drinking glucose: less than 180-190 mg/dL (10.0-10.5 mmol/L).  2 hours after drinking glucose: less than 155-165 mg/dL (8.6-9.2 mmol/L).  3 hours after drinking glucose: 140-145 mg/dL (7.8-8.1 mmol/L). What do the results mean? Results within reference ranges are considered normal, meaning that your glucose levels are well-controlled. If two or more of your blood glucose levels are high, you may be diagnosed with gestational diabetes. If only one level is high, your health care provider may suggest repeat testing or other tests to confirm a diagnosis. Talk with your health care provider about what your results mean. Questions to ask your health care provider Ask your health care provider, or the department that is doing the test:  When will my results be ready?  How will I get my results?  What are my treatment options?  What other tests do I need?  What are my next steps? Summary  The glucose tolerance test (GTT) is one of several tests used to diagnose diabetes that develops during pregnancy (gestational diabetes mellitus). Gestational diabetes is a temporary form of diabetes that some women develop during pregnancy.  You may have the GTT test after having a 1-hour glucose screening test if the results from that test indicate that you may have gestational diabetes. You may also have this test if you have any symptoms or risk factors for gestational diabetes.  Talk with your health care provider about what your results mean. This information is not intended to replace advice given to you by your health care provider. Make sure you discuss any questions you have with your health care provider. Document Revised: 07/09/2018 Document Reviewed: 10/28/2016 Elsevier Patient Education  [place].       https://www.cdc.gov/vaccines/hcp/vis/vis-statements/tdap.pdf">  Tdap (Tetanus, Diphtheria, Pertussis) Vaccine: What You Need to Know 1. Why get vaccinated? Tdap vaccine can prevent tetanus, diphtheria, and pertussis. Diphtheria and pertussis spread from person to person. Tetanus enters the body through cuts or wounds.  TETANUS (T) causes painful stiffening of the muscles. Tetanus can lead to serious health problems, including being unable to open the mouth, having trouble swallowing and breathing, or death.  DIPHTHERIA (D) can lead to difficulty breathing, heart failure, paralysis, or death.  PERTUSSIS (aP), also known as "whooping cough," can cause uncontrollable, violent coughing which makes it hard to breathe, eat, or drink. Pertussis can be extremely serious in babies and young children, causing pneumonia, convulsions, brain damage, or death. In teens and adults, it can cause weight loss, loss of bladder control, passing out, and rib fractures from severe coughing. 2. Tdap vaccine Tdap is only for children 7 years and older, adolescents, and adults.  Adolescents should receive a single dose of Tdap, preferably at age 31 or 69 years. Pregnant women should get a dose of Tdap during every pregnancy, to protect  the newborn from pertussis. Infants are most at risk for severe, life-threatening complications from pertussis. Adults who have never received Tdap should get a dose of Tdap. Also, adults should receive a booster dose every 10 years, or earlier in the case of a severe and dirty wound or burn. Booster doses can be either Tdap or Td (a different vaccine that protects against tetanus and diphtheria but not pertussis). Tdap may be given at the same time as other vaccines. 3. Talk with your health care provider Tell your vaccine provider if the person getting the vaccine:  Has had an allergic reaction after a previous dose of any vaccine that protects against tetanus,  diphtheria, or pertussis, or has any severe, life-threatening allergies.  Has had a coma, decreased level of consciousness, or prolonged seizures within 7 days after a previous dose of any pertussis vaccine (DTP, DTaP, or Tdap).  Has seizures or another nervous system problem.  Has ever had Guillain-Barr Syndrome (also called GBS).  Has had severe pain or swelling after a previous dose of any vaccine that protects against tetanus or diphtheria. In some cases, your health care provider may decide to postpone Tdap vaccination to a future visit.  People with minor illnesses, such as a cold, may be vaccinated. People who are moderately or severely ill should usually wait until they recover before getting Tdap vaccine.  Your health care provider can give you more information. 4. Risks of a vaccine reaction  Pain, redness, or swelling where the shot was given, mild fever, headache, feeling tired, and nausea, vomiting, diarrhea, or stomachache sometimes happen after Tdap vaccine. People sometimes faint after medical procedures, including vaccination. Tell your provider if you feel dizzy or have vision changes or ringing in the ears.  As with any medicine, there is a very remote chance of a vaccine causing a severe allergic reaction, other serious injury, or death. 5. What if there is a serious problem? An allergic reaction could occur after the vaccinated person leaves the clinic. If you see signs of a severe allergic reaction (hives, swelling of the face and throat, difficulty breathing, a fast heartbeat, dizziness, or weakness), call 9-1-1 and get the person to the nearest hospital. For other signs that concern you, call your health care provider.  Adverse reactions should be reported to the Vaccine Adverse Event Reporting System (VAERS). Your health care provider will usually file this report, or you can do it yourself. Visit the VAERS website at www.vaers.LAgents.no or call 425-584-4385. VAERS is  only for reporting reactions, and VAERS staff do not give medical advice. 6. The National Vaccine Injury Compensation Program The [employer] Vaccine Injury Compensation Program (VICP) is a federal program that was created to compensate people who may have been injured by certain vaccines. Visit the VICP website at SpiritualWord.at or call 9090722200 to learn about the program and about filing a claim. There is a time limit to file a claim for compensation. 7. How can I learn more?  Ask your health care provider.  Call your local or state health department.  Contact the Centers for Disease Control and Prevention (CDC): ? Call (952)670-3483 (1-800-CDC-INFO) or ? Visit CDC's website at PicCapture.uy Vaccine Information Statement Tdap (Tetanus, Diphtheria, Pertussis) Vaccine (07/01/2018) This information is not intended to replace advice given to you by your health care provider. Make sure you discuss any questions you have with your health care provider. Document Revised: 07/10/2018 Document Reviewed: 07/13/2018 Elsevier Patient Education  2020 [employer].

## 2019-12-22 ENCOUNTER — Ambulatory Visit: Payer: Medicaid Other | Admitting: *Deleted

## 2019-12-22 ENCOUNTER — Other Ambulatory Visit: Payer: Self-pay

## 2019-12-22 ENCOUNTER — Other Ambulatory Visit (HOSPITAL_COMMUNITY)
Admission: RE | Admit: 2019-12-22 | Discharge: 2019-12-22 | Disposition: A | Discharge status: Home / Self Care | Payer: Medicaid Other | Source: Ambulatory Visit | Attending: Obstetrics and Gynecology | Admitting: Obstetrics and Gynecology

## 2019-12-22 VITALS — BP 113/68 | HR 69

## 2019-12-22 DIAGNOSIS — Z113 Encounter for screening for infections with a predominantly sexual mode of transmission: Secondary | ICD-10-CM

## 2019-12-22 DIAGNOSIS — Z3480 Encounter for supervision of other normal pregnancy, unspecified trimester: Secondary | ICD-10-CM

## 2019-12-22 DIAGNOSIS — O09299 Supervision of pregnancy with other poor reproductive or obstetric history, unspecified trimester: Secondary | ICD-10-CM | POA: Diagnosis not present

## 2019-12-22 NOTE — Progress Notes (Signed)
Pt is in office for self swab.  Pt states she may have yeast infection but wants to "check for everything". Full panel swab was ordered today.  Pt to be made aware of any abnormal results.   Pt has no other concerns today.  BP 113/68   Pulse 69   LMP 07/01/2019 (Exact Date)

## 2019-12-22 NOTE — Progress Notes (Signed)
Patient was assessed and managed by nursing staff during this encounter. I have reviewed the chart and agree with the documentation and plan. I have also made any necessary editorial changes.  Marylen Ponto, NP 12/22/2019 11:07 AM

## 2019-12-23 ENCOUNTER — Encounter: Payer: Self-pay | Admitting: Women's Health

## 2019-12-23 ENCOUNTER — Other Ambulatory Visit: Payer: Self-pay | Admitting: Women's Health

## 2019-12-23 DIAGNOSIS — O98812 Other maternal infectious and parasitic diseases complicating pregnancy, second trimester: Secondary | ICD-10-CM

## 2019-12-23 DIAGNOSIS — A749 Chlamydial infection, unspecified: Secondary | ICD-10-CM | POA: Insufficient documentation

## 2019-12-23 LAB — CERVICOVAGINAL ANCILLARY ONLY
Bacterial Vaginitis (gardnerella): NEGATIVE
Candida Glabrata: NEGATIVE
Candida Vaginitis: NEGATIVE
Chlamydia: POSITIVE — AB
Comment: NEGATIVE
Comment: NEGATIVE
Comment: NEGATIVE
Comment: NEGATIVE
Comment: NEGATIVE
Comment: NORMAL
Neisseria Gonorrhea: NEGATIVE
Trichomonas: NEGATIVE

## 2019-12-23 MED ORDER — AZITHROMYCIN 500 MG PO TABS: 1000.0000 mg | ORAL_TABLET | Freq: Once | ORAL | 0 refills | Status: AC

## 2019-12-23 NOTE — Progress Notes (Signed)
RX azithromycin.  Marylen Ponto, NP  2:03 PM 12/23/2019

## 2020-01-04 ENCOUNTER — Other Ambulatory Visit: Payer: Self-pay

## 2020-01-04 MED ORDER — PRENATAL 27-0.8 MG PO TABS: 1.0000 | ORAL_TABLET | Freq: Every day | ORAL | 12 refills | Status: DC

## 2020-01-10 ENCOUNTER — Other Ambulatory Visit (HOSPITAL_COMMUNITY)
Admission: RE | Admit: 2020-01-10 | Discharge: 2020-01-10 | Disposition: A | Discharge status: Home / Self Care | Payer: Medicaid Other | Source: Ambulatory Visit | Attending: Advanced Practice Midwife | Admitting: Advanced Practice Midwife

## 2020-01-10 ENCOUNTER — Other Ambulatory Visit: Payer: Medicaid Other

## 2020-01-10 ENCOUNTER — Ambulatory Visit (INDEPENDENT_AMBULATORY_CARE_PROVIDER_SITE_OTHER): Payer: Medicaid Other | Admitting: Advanced Practice Midwife

## 2020-01-10 ENCOUNTER — Other Ambulatory Visit: Payer: Self-pay

## 2020-01-10 ENCOUNTER — Encounter: Payer: Self-pay | Admitting: Advanced Practice Midwife

## 2020-01-10 VITALS — BP 117/65 | HR 81 | Wt 162.6 lb

## 2020-01-10 DIAGNOSIS — A749 Chlamydial infection, unspecified: Secondary | ICD-10-CM | POA: Diagnosis present

## 2020-01-10 DIAGNOSIS — D509 Iron deficiency anemia, unspecified: Secondary | ICD-10-CM

## 2020-01-10 DIAGNOSIS — O99891 Other specified diseases and conditions complicating pregnancy: Secondary | ICD-10-CM

## 2020-01-10 DIAGNOSIS — Z3480 Encounter for supervision of other normal pregnancy, unspecified trimester: Secondary | ICD-10-CM

## 2020-01-10 DIAGNOSIS — M549 Dorsalgia, unspecified: Secondary | ICD-10-CM

## 2020-01-10 DIAGNOSIS — O98812 Other maternal infectious and parasitic diseases complicating pregnancy, second trimester: Secondary | ICD-10-CM | POA: Diagnosis not present

## 2020-01-10 DIAGNOSIS — O99019 Anemia complicating pregnancy, unspecified trimester: Secondary | ICD-10-CM

## 2020-01-10 DIAGNOSIS — Z23 Encounter for immunization: Secondary | ICD-10-CM | POA: Diagnosis not present

## 2020-01-10 DIAGNOSIS — Z3A27 27 weeks gestation of pregnancy: Secondary | ICD-10-CM

## 2020-01-10 MED ORDER — PRENATE MINI 18-0.6-0.4-350 MG PO CAPS: 1.0000 | ORAL_CAPSULE | Freq: Every day | ORAL | 11 refills | Status: DC

## 2020-01-10 NOTE — Progress Notes (Signed)
Pt presents for 2gtt labs Tdap and flu vaccines given today Pt c/o vaginal itching on the inside TOC CT today - unsure if partner has been treated

## 2020-01-10 NOTE — Patient Instructions (Signed)
Third Trimester of Pregnancy The third trimester is from week 28 through week 40 (months 7 through 9). The third trimester is a time when the unborn baby (fetus) is growing rapidly. At the end of the ninth month, the fetus is about 20 inches in length and weighs 6-10 pounds. Body changes during your third trimester Your body will continue to go through many changes during pregnancy. The changes vary from woman to woman. During the third trimester:  Your weight will continue to increase. You can expect to gain 25-35 pounds (11-16 kg) by the end of the pregnancy.  You may begin to get stretch marks on your hips, abdomen, and breasts.  You may urinate more often because the fetus is moving lower into your pelvis and pressing on your bladder.  You may develop or continue to have heartburn. This is caused by increased hormones that slow down muscles in the digestive tract.  You may develop or continue to have constipation because increased hormones slow digestion and cause the muscles that push waste through your intestines to relax.  You may develop hemorrhoids. These are swollen veins (varicose veins) in the rectum that can itch or be painful.  You may develop swollen, bulging veins (varicose veins) in your legs.  You may have increased body aches in the pelvis, back, or thighs. This is due to weight gain and increased hormones that are relaxing your joints.  You may have changes in your hair. These can include thickening of your hair, rapid growth, and changes in texture. Some women also have hair loss during or after pregnancy, or hair that feels dry or thin. Your hair will most likely return to normal after your baby is born.  Your breasts will continue to grow and they will continue to become tender. A yellow fluid (colostrum) may leak from your breasts. This is the first milk you are producing for your baby.  Your belly button may stick out.  You may notice more swelling in your hands,  face, or ankles.  You may have increased tingling or numbness in your hands, arms, and legs. The skin on your belly may also feel numb.  You may feel short of breath because of your expanding uterus.  You may have more problems sleeping. This can be caused by the size of your belly, increased need to urinate, and an increase in your body's metabolism.  You may notice the fetus "dropping," or moving lower in your abdomen (lightening).  You may have increased vaginal discharge.  You may notice your joints feel loose and you may have pain around your pelvic bone. What to expect at prenatal visits You will have prenatal exams every 2 weeks until week 36. Then you will have weekly prenatal exams. During a routine prenatal visit:  You will be weighed to make sure you and the baby are growing normally.  Your blood pressure will be taken.  Your abdomen will be measured to track your baby's growth.  The fetal heartbeat will be listened to.  Any test results from the previous visit will be discussed.  You may have a cervical check near your due date to see if your cervix has softened or thinned (effaced).  You will be tested for Group B streptococcus. This happens between 35 and 37 weeks. Your health care provider may ask you:  What your birth plan is.  How you are feeling.  If you are feeling the baby move.  If you have had any abnormal   symptoms, such as leaking fluid, bleeding, severe headaches, or abdominal cramping.  If you are using any tobacco products, including cigarettes, chewing tobacco, and electronic cigarettes.  If you have any questions. Other tests or screenings that may be performed during your third trimester include:  Blood tests that check for low iron levels (anemia).  Fetal testing to check the health, activity level, and growth of the fetus. Testing is done if you have certain medical conditions or if there are problems during the pregnancy.  Nonstress test  (NST). This test checks the health of your baby to make sure there are no signs of problems, such as the baby not getting enough oxygen. During this test, a belt is placed around your belly. The baby is made to move, and its heart rate is monitored during movement. What is false labor? False labor is a condition in which you feel small, irregular tightenings of the muscles in the womb (contractions) that usually go away with rest, changing position, or drinking water. These are called Braxton Hicks contractions. Contractions may last for hours, days, or even weeks before true labor sets in. If contractions come at regular intervals, become more frequent, increase in intensity, or become painful, you should see your health care provider. What are the signs of labor?  Abdominal cramps.  Regular contractions that start at 10 minutes apart and become stronger and more frequent with time.  Contractions that start on the top of the uterus and spread down to the lower abdomen and back.  Increased pelvic pressure and dull back pain.  A watery or bloody mucus discharge that comes from the vagina.  Leaking of amniotic fluid. This is also known as your "water breaking." It could be a slow trickle or a gush. Let your health care provider know if it has a color or strange odor. If you have any of these signs, call your health care provider right away, even if it is before your due date. Follow these instructions at home: Medicines  Follow your health care provider's instructions regarding medicine use. Specific medicines may be either safe or unsafe to take during pregnancy.  Take a prenatal vitamin that contains at least 600 micrograms (mcg) of folic acid.  If you develop constipation, try taking a stool softener if your health care provider approves. Eating and drinking   Eat a balanced diet that includes fresh fruits and vegetables, whole grains, good sources of protein such as meat, eggs, or tofu,  and low-fat dairy. Your health care provider will help you determine the amount of weight gain that is right for you.  Avoid raw meat and uncooked cheese. These carry germs that can cause birth defects in the baby.  If you have low calcium intake from food, talk to your health care provider about whether you should take a daily calcium supplement.  Eat four or five small meals rather than three large meals a day.  Limit foods that are high in fat and processed sugars, such as fried and sweet foods.  To prevent constipation: ? Drink enough fluid to keep your urine clear or pale yellow. ? Eat foods that are high in fiber, such as fresh fruits and vegetables, whole grains, and beans. Activity  Exercise only as directed by your health care provider. Most women can continue their usual exercise routine during pregnancy. Try to exercise for 30 minutes at least 5 days a week. Stop exercising if you experience uterine contractions.  Avoid heavy lifting.  Do   not exercise in extreme heat or humidity, or at high altitudes.  Wear low-heel, comfortable shoes.  Practice good posture.  You may continue to have sex unless your health care provider tells you otherwise. Relieving pain and discomfort  Take frequent breaks and rest with your legs elevated if you have leg cramps or low back pain.  Take warm sitz baths to soothe any pain or discomfort caused by hemorrhoids. Use hemorrhoid cream if your health care provider approves.  Wear a good support bra to prevent discomfort from breast tenderness.  If you develop varicose veins: ? Wear support pantyhose or compression stockings as told by your healthcare provider. ? Elevate your feet for 15 minutes, 3-4 times a day. Prenatal care  Write down your questions. Take them to your prenatal visits.  Keep all your prenatal visits as told by your health care provider. This is important. Safety  Wear your seat belt at all times when driving.  Make  a list of emergency phone numbers, including numbers for family, friends, the hospital, and police and fire departments. General instructions  Avoid cat litter boxes and soil used by cats. These carry germs that can cause birth defects in the baby. If you have a cat, ask someone to clean the litter box for you.  Do not travel far distances unless it is absolutely necessary and only with the approval of your health care provider.  Do not use hot tubs, steam rooms, or saunas.  Do not drink alcohol.  Do not use any products that contain nicotine or tobacco, such as cigarettes and e-cigarettes. If you need help quitting, ask your health care provider.  Do not use any medicinal herbs or unprescribed drugs. These chemicals affect the formation and growth of the baby.  Do not douche or use tampons or scented sanitary pads.  Do not cross your legs for long periods of time.  To prepare for the arrival of your baby: ? Take prenatal classes to understand, practice, and ask questions about labor and delivery. ? Make a trial run to the hospital. ? Visit the hospital and tour the maternity area. ? Arrange for maternity or paternity leave through employers. ? Arrange for family and friends to take care of pets while you are in the hospital. ? Purchase a rear-facing car seat and make sure you know how to install it in your car. ? Pack your hospital bag. ? Prepare the baby's nursery. Make sure to remove all pillows and stuffed animals from the baby's crib to prevent suffocation.  Visit your dentist if you have not gone during your pregnancy. Use a soft toothbrush to brush your teeth and be gentle when you floss. Contact a health care provider if:  You are unsure if you are in labor or if your water has broken.  You become dizzy.  You have mild pelvic cramps, pelvic pressure, or nagging pain in your abdominal area.  You have lower back pain.  You have persistent nausea, vomiting, or  diarrhea.  You have an unusual or bad smelling vaginal discharge.  You have pain when you urinate. Get help right away if:  Your water breaks before 37 weeks.  You have regular contractions less than 5 minutes apart before 37 weeks.  You have a fever.  You are leaking fluid from your vagina.  You have spotting or bleeding from your vagina.  You have severe abdominal pain or cramping.  You have rapid weight loss or weight gain.  You have   shortness of breath with chest pain.  You notice sudden or extreme swelling of your face, hands, ankles, feet, or legs.  Your baby makes fewer than 10 movements in 2 hours.  You have severe headaches that do not go away when you take medicine.  You have vision changes. Summary  The third trimester is from week 28 through week 40, months 7 through 9. The third trimester is a time when the unborn baby (fetus) is growing rapidly.  During the third trimester, your discomfort may increase as you and your baby continue to gain weight. You may have abdominal, leg, and back pain, sleeping problems, and an increased need to urinate.  During the third trimester your breasts will keep growing and they will continue to become tender. A yellow fluid (colostrum) may leak from your breasts. This is the first milk you are producing for your baby.  False labor is a condition in which you feel small, irregular tightenings of the muscles in the womb (contractions) that eventually go away. These are called Braxton Hicks contractions. Contractions may last for hours, days, or even weeks before true labor sets in.  Signs of labor can include: abdominal cramps; regular contractions that start at 10 minutes apart and become stronger and more frequent with time; watery or bloody mucus discharge that comes from the vagina; increased pelvic pressure and dull back pain; and leaking of amniotic fluid. This information is not intended to replace advice given to you by your  health care provider. Make sure you discuss any questions you have with your health care provider. Document Revised: 07/09/2018 Document Reviewed: 04/23/2016 Elsevier Patient Education  2020 [employer]  

## 2020-01-10 NOTE — Progress Notes (Signed)
   PRENATAL VISIT NOTE  Subjective:  Ann Boyd is a 19 y.o. G2P0010 at [redacted]w[redacted]d being seen today for ongoing prenatal care.  She is currently monitored for the following issues for this low-risk pregnancy and has Supervision of normal pregnancy, antepartum; Urinary tract infection in mother during first trimester of pregnancy; Chlamydia infection affecting pregnancy in first trimester; Iron deficiency anemia of pregnancy; and Chlamydia infection affecting pregnancy in second trimester on their problem list.  Patient reports backache.  Contractions: Not present. Vag. Bleeding: None.  Movement: Present. Denies leaking of fluid.   The following portions of the patient's history were reviewed and updated as appropriate: allergies, current medications, past family history, past medical history, past social history, past surgical history and problem list.   Objective:   Vitals:   01/10/20 0902  BP: 117/65  Pulse: 81  Weight: 162 lb 9.6 oz (73.8 kg)    Fetal Status:     Movement: Present     General:  Alert, oriented and cooperative. Patient is in no acute distress.  Skin: Skin is warm and dry. No rash noted.   Cardiovascular: Normal heart rate noted  Respiratory: Normal respiratory effort, no problems with respiration noted  Abdomen: Soft, gravid, appropriate for gestational age.  Pain/Pressure: Absent     Pelvic: Cervical exam deferred        Extremities: Normal range of motion.  Edema: Trace  Mental Status: Normal mood and affect. Normal behavior. Normal judgment and thought content.   Assessment and Plan:  Pregnancy: G2P0010 at [redacted]w[redacted]d 1. Supervision of other normal pregnancy, antepartum --Anticipatory guidance about next visits/weeks of pregnancy given.  2. Chlamydia infection affecting pregnancy in second trimester --Positive 6/14 with neg TOC, then positive 9/23. Treated. --TOC today --Expedited partner therapy for 1 female partner today. She is unsure if he was previously  treated.  3. Iron deficiency anemia of pregnancy --Rx for PNV sent, pt insurance does not cover VItafol Fe.   --Continue ferrous gluconate daily or every other day if GI symptoms develop  4. [redacted] weeks gestation of pregnancy   5. 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  Preterm labor symptoms and general obstetric precautions including but not limited to vaginal bleeding, contractions, leaking of fluid and fetal movement were reviewed in detail with the patient. Please refer to After Visit Summary for other counseling recommendations.   Return in about 4 weeks (around 02/07/2020).  No future appointments.  Sharen Counter, CNM

## 2020-01-11 LAB — CBC
Hematocrit: 31.3 % — ABNORMAL LOW (ref 34.0–46.6)
Hemoglobin: 10.2 g/dL — ABNORMAL LOW (ref 11.1–15.9)
MCH: 28.6 pg (ref 26.6–33.0)
MCHC: 32.6 g/dL (ref 31.5–35.7)
MCV: 88 fL (ref 79–97)
Platelets: 206 10*3/uL (ref 150–450)
RBC: 3.57 x10E6/uL — ABNORMAL LOW (ref 3.77–5.28)
RDW: 14.2 % (ref 11.7–15.4)
WBC: 7.6 10*3/uL (ref 3.4–10.8)

## 2020-01-11 LAB — HIV ANTIBODY (ROUTINE TESTING W REFLEX): HIV Screen 4th Generation wRfx: NONREACTIVE

## 2020-01-11 LAB — RPR: RPR Ser Ql: NONREACTIVE

## 2020-01-11 LAB — CERVICOVAGINAL ANCILLARY ONLY
Chlamydia: NEGATIVE
Comment: NEGATIVE
Comment: NORMAL
Neisseria Gonorrhea: NEGATIVE

## 2020-01-11 LAB — GLUCOSE TOLERANCE, 2 HOURS W/ 1HR
Glucose, 1 hour: 92 mg/dL (ref 65–179)
Glucose, 2 hour: 86 mg/dL (ref 65–152)
Glucose, Fasting: 73 mg/dL (ref 65–91)

## 2020-02-07 ENCOUNTER — Telehealth (INDEPENDENT_AMBULATORY_CARE_PROVIDER_SITE_OTHER): Payer: Medicaid Other | Admitting: Advanced Practice Midwife

## 2020-02-07 VITALS — BP 114/76 | HR 86

## 2020-02-07 DIAGNOSIS — D509 Iron deficiency anemia, unspecified: Secondary | ICD-10-CM

## 2020-02-07 DIAGNOSIS — O98813 Other maternal infectious and parasitic diseases complicating pregnancy, third trimester: Secondary | ICD-10-CM

## 2020-02-07 DIAGNOSIS — O98812 Other maternal infectious and parasitic diseases complicating pregnancy, second trimester: Secondary | ICD-10-CM

## 2020-02-07 DIAGNOSIS — O99013 Anemia complicating pregnancy, third trimester: Secondary | ICD-10-CM

## 2020-02-07 DIAGNOSIS — O99891 Other specified diseases and conditions complicating pregnancy: Secondary | ICD-10-CM

## 2020-02-07 DIAGNOSIS — A749 Chlamydial infection, unspecified: Secondary | ICD-10-CM

## 2020-02-07 DIAGNOSIS — Z3A31 31 weeks gestation of pregnancy: Secondary | ICD-10-CM

## 2020-02-07 DIAGNOSIS — Z3490 Encounter for supervision of normal pregnancy, unspecified, unspecified trimester: Secondary | ICD-10-CM

## 2020-02-07 DIAGNOSIS — N898 Other specified noninflammatory disorders of vagina: Secondary | ICD-10-CM

## 2020-02-07 NOTE — Progress Notes (Signed)
Virtual ROB   Pt able to check B/P while on the phone.  B/P :114/76  P: 86  CC:  None

## 2020-02-07 NOTE — Progress Notes (Signed)
   OBSTETRICS PRENATAL VIRTUAL VISIT ENCOUNTER NOTE  Provider location: [facility]   I connected with Ann Boyd on 02/07/20 at  8:35 AM EST by MyChart Video Encounter at home and verified that I am speaking with the correct person using two identifiers.   I discussed the limitations, risks, security and privacy concerns of performing an evaluation and management service virtually and the availability of in person appointments. I also discussed with the patient that there may be a patient responsible charge related to this service. The patient expressed understanding and agreed to proceed. Subjective:  Ann Boyd is a 19 y.o. G2P0010 at [redacted]w[redacted]d being seen today for ongoing prenatal care.  She is currently monitored for the following issues for this low-risk pregnancy and has Supervision of normal pregnancy, antepartum; Urinary tract infection in mother during first trimester of pregnancy; Chlamydia infection affecting pregnancy in first trimester; Iron deficiency anemia of pregnancy; and Chlamydia infection affecting pregnancy in second trimester on their problem list.  Patient reports increased vaginal discharge.  Contractions: Not present. Vag. Bleeding: None.  Movement: Present. Denies any leaking of fluid.   The following portions of the patient's history were reviewed and updated as appropriate: allergies, current medications, past family history, past medical history, past social history, past surgical history and problem list.   Objective:   Vitals:   02/07/20 0831  BP: 114/76  Pulse: 86    Fetal Status:     Movement: Present     General:  Alert, oriented and cooperative. Patient is in no acute distress.  Respiratory: Normal respiratory effort, no problems with respiration noted  Mental Status: Normal mood and affect. Normal behavior. Normal judgment and thought content.  Rest of physical exam deferred due to type of encounter  Imaging: No  results found.  Assessment and Plan:  Pregnancy: G2P0010 at [redacted]w[redacted]d 1. Chlamydia infection affecting pregnancy in second trimester --TOC neg  2. Encounter for supervision of normal pregnancy, antepartum, unspecified gravidity --Pt reports good fetal movement, denies cramping, LOF, or vaginal bleeding --Anticipatory guidance about next visits/weeks of pregnancy given. --Next visit in 2 weeks in office   3. Iron deficiency anemia of pregnancy --Taking PNV and oral iron every other day, Hgb up from 9.6 to 10.2 with supplemenation. --Reports some "seeing stars" when stands up suddenly, no other s/sx of anemia, pt encouraged to increase PO fluids, notify office if symptoms worsening  4. [redacted] weeks gestation of pregnancy   5. Vaginal discharge during pregnancy in third trimester --Pt with thin discharge, wearing pad/liner daily, no odor or itching.  No intercourse since chlamydia tx. --Likely leukorrhea of pregnancy --Consider swab at next visit if worsening/persistent  Preterm labor symptoms and general obstetric precautions including but not limited to vaginal bleeding, contractions, leaking of fluid and fetal movement were reviewed in detail with the patient. I discussed the assessment and treatment plan with the patient. The patient was provided an opportunity to ask questions and all were answered. The patient agreed with the plan and demonstrated an understanding of the instructions. The patient was advised to call back or seek an in-person office evaluation/go to [facility] at [facility] for any urgent or concerning symptoms. Please refer to After Visit Summary for other counseling recommendations.   I provided 7 minutes of face-to-face time during this encounter.  Return in about 2 weeks (around 02/21/2020).  No future appointments.  Sharen Counter, CNM [facility] [employer]

## 2020-02-21 ENCOUNTER — Other Ambulatory Visit: Payer: Self-pay

## 2020-02-21 ENCOUNTER — Ambulatory Visit (INDEPENDENT_AMBULATORY_CARE_PROVIDER_SITE_OTHER): Payer: Medicaid Other | Admitting: Advanced Practice Midwife

## 2020-02-21 VITALS — BP 116/76 | HR 81 | Wt 171.0 lb

## 2020-02-21 DIAGNOSIS — D509 Iron deficiency anemia, unspecified: Secondary | ICD-10-CM

## 2020-02-21 DIAGNOSIS — Z3A33 33 weeks gestation of pregnancy: Secondary | ICD-10-CM

## 2020-02-21 DIAGNOSIS — O99019 Anemia complicating pregnancy, unspecified trimester: Secondary | ICD-10-CM

## 2020-02-21 DIAGNOSIS — G4701 Insomnia due to medical condition: Secondary | ICD-10-CM

## 2020-02-21 DIAGNOSIS — Z3490 Encounter for supervision of normal pregnancy, unspecified, unspecified trimester: Secondary | ICD-10-CM

## 2020-02-21 NOTE — Patient Instructions (Signed)
Third Trimester of Pregnancy The third trimester is from week 28 through week 40 (months 7 through 9). The third trimester is a time when the unborn baby (fetus) is growing rapidly. At the end of the ninth month, the fetus is about 20 inches in length and weighs 6-10 pounds. Body changes during your third trimester Your body will continue to go through many changes during pregnancy. The changes vary from woman to woman. During the third trimester:  Your weight will continue to increase. You can expect to gain 25-35 pounds (11-16 kg) by the end of the pregnancy.  You may begin to get stretch marks on your hips, abdomen, and breasts.  You may urinate more often because the fetus is moving lower into your pelvis and pressing on your bladder.  You may develop or continue to have heartburn. This is caused by increased hormones that slow down muscles in the digestive tract.  You may develop or continue to have constipation because increased hormones slow digestion and cause the muscles that push waste through your intestines to relax.  You may develop hemorrhoids. These are swollen veins (varicose veins) in the rectum that can itch or be painful.  You may develop swollen, bulging veins (varicose veins) in your legs.  You may have increased body aches in the pelvis, back, or thighs. This is due to weight gain and increased hormones that are relaxing your joints.  You may have changes in your hair. These can include thickening of your hair, rapid growth, and changes in texture. Some women also have hair loss during or after pregnancy, or hair that feels dry or thin. Your hair will most likely return to normal after your baby is born.  Your breasts will continue to grow and they will continue to become tender. A yellow fluid (colostrum) may leak from your breasts. This is the first milk you are producing for your baby.  Your belly button may stick out.  You may notice more swelling in your hands,  face, or ankles.  You may have increased tingling or numbness in your hands, arms, and legs. The skin on your belly may also feel numb.  You may feel short of breath because of your expanding uterus.  You may have more problems sleeping. This can be caused by the size of your belly, increased need to urinate, and an increase in your body's metabolism.  You may notice the fetus "dropping," or moving lower in your abdomen (lightening).  You may have increased vaginal discharge.  You may notice your joints feel loose and you may have pain around your pelvic bone. What to expect at prenatal visits You will have prenatal exams every 2 weeks until week 36. Then you will have weekly prenatal exams. During a routine prenatal visit:  You will be weighed to make sure you and the baby are growing normally.  Your blood pressure will be taken.  Your abdomen will be measured to track your baby's growth.  The fetal heartbeat will be listened to.  Any test results from the previous visit will be discussed.  You may have a cervical check near your due date to see if your cervix has softened or thinned (effaced).  You will be tested for Group B streptococcus. This happens between 35 and 37 weeks. Your health care provider may ask you:  What your birth plan is.  How you are feeling.  If you are feeling the baby move.  If you have had any abnormal   symptoms, such as leaking fluid, bleeding, severe headaches, or abdominal cramping.  If you are using any tobacco products, including cigarettes, chewing tobacco, and electronic cigarettes.  If you have any questions. Other tests or screenings that may be performed during your third trimester include:  Blood tests that check for low iron levels (anemia).  Fetal testing to check the health, activity level, and growth of the fetus. Testing is done if you have certain medical conditions or if there are problems during the pregnancy.  Nonstress test  (NST). This test checks the health of your baby to make sure there are no signs of problems, such as the baby not getting enough oxygen. During this test, a belt is placed around your belly. The baby is made to move, and its heart rate is monitored during movement. What is false labor? False labor is a condition in which you feel small, irregular tightenings of the muscles in the womb (contractions) that usually go away with rest, changing position, or drinking water. These are called Braxton Hicks contractions. Contractions may last for hours, days, or even weeks before true labor sets in. If contractions come at regular intervals, become more frequent, increase in intensity, or become painful, you should see your health care provider. What are the signs of labor?  Abdominal cramps.  Regular contractions that start at 10 minutes apart and become stronger and more frequent with time.  Contractions that start on the top of the uterus and spread down to the lower abdomen and back.  Increased pelvic pressure and dull back pain.  A watery or bloody mucus discharge that comes from the vagina.  Leaking of amniotic fluid. This is also known as your "water breaking." It could be a slow trickle or a gush. Let your health care provider know if it has a color or strange odor. If you have any of these signs, call your health care provider right away, even if it is before your due date. Follow these instructions at home: Medicines  Follow your health care provider's instructions regarding medicine use. Specific medicines may be either safe or unsafe to take during pregnancy.  Take a prenatal vitamin that contains at least 600 micrograms (mcg) of folic acid.  If you develop constipation, try taking a stool softener if your health care provider approves. Eating and drinking   Eat a balanced diet that includes fresh fruits and vegetables, whole grains, good sources of protein such as meat, eggs, or tofu,  and low-fat dairy. Your health care provider will help you determine the amount of weight gain that is right for you.  Avoid raw meat and uncooked cheese. These carry germs that can cause birth defects in the baby.  If you have low calcium intake from food, talk to your health care provider about whether you should take a daily calcium supplement.  Eat four or five small meals rather than three large meals a day.  Limit foods that are high in fat and processed sugars, such as fried and sweet foods.  To prevent constipation: ? Drink enough fluid to keep your urine clear or pale yellow. ? Eat foods that are high in fiber, such as fresh fruits and vegetables, whole grains, and beans. Activity  Exercise only as directed by your health care provider. Most women can continue their usual exercise routine during pregnancy. Try to exercise for 30 minutes at least 5 days a week. Stop exercising if you experience uterine contractions.  Avoid heavy lifting.  Do   not exercise in extreme heat or humidity, or at high altitudes.  Wear low-heel, comfortable shoes.  Practice good posture.  You may continue to have sex unless your health care provider tells you otherwise. Relieving pain and discomfort  Take frequent breaks and rest with your legs elevated if you have leg cramps or low back pain.  Take warm sitz baths to soothe any pain or discomfort caused by hemorrhoids. Use hemorrhoid cream if your health care provider approves.  Wear a good support bra to prevent discomfort from breast tenderness.  If you develop varicose veins: ? Wear support pantyhose or compression stockings as told by your healthcare provider. ? Elevate your feet for 15 minutes, 3-4 times a day. Prenatal care  Write down your questions. Take them to your prenatal visits.  Keep all your prenatal visits as told by your health care provider. This is important. Safety  Wear your seat belt at all times when driving.  Make  a list of emergency phone numbers, including numbers for family, friends, the hospital, and police and fire departments. General instructions  Avoid cat litter boxes and soil used by cats. These carry germs that can cause birth defects in the baby. If you have a cat, ask someone to clean the litter box for you.  Do not travel far distances unless it is absolutely necessary and only with the approval of your health care provider.  Do not use hot tubs, steam rooms, or saunas.  Do not drink alcohol.  Do not use any products that contain nicotine or tobacco, such as cigarettes and e-cigarettes. If you need help quitting, ask your health care provider.  Do not use any medicinal herbs or unprescribed drugs. These chemicals affect the formation and growth of the baby.  Do not douche or use tampons or scented sanitary pads.  Do not cross your legs for long periods of time.  To prepare for the arrival of your baby: ? Take prenatal classes to understand, practice, and ask questions about labor and delivery. ? Make a trial run to the hospital. ? Visit the hospital and tour the maternity area. ? Arrange for maternity or paternity leave through employers. ? Arrange for family and friends to take care of pets while you are in the hospital. ? Purchase a rear-facing car seat and make sure you know how to install it in your car. ? Pack your hospital bag. ? Prepare the baby's nursery. Make sure to remove all pillows and stuffed animals from the baby's crib to prevent suffocation.  Visit your dentist if you have not gone during your pregnancy. Use a soft toothbrush to brush your teeth and be gentle when you floss. Contact a health care provider if:  You are unsure if you are in labor or if your water has broken.  You become dizzy.  You have mild pelvic cramps, pelvic pressure, or nagging pain in your abdominal area.  You have lower back pain.  You have persistent nausea, vomiting, or  diarrhea.  You have an unusual or bad smelling vaginal discharge.  You have pain when you urinate. Get help right away if:  Your water breaks before 37 weeks.  You have regular contractions less than 5 minutes apart before 37 weeks.  You have a fever.  You are leaking fluid from your vagina.  You have spotting or bleeding from your vagina.  You have severe abdominal pain or cramping.  You have rapid weight loss or weight gain.  You have   shortness of breath with chest pain.  You notice sudden or extreme swelling of your face, hands, ankles, feet, or legs.  Your baby makes fewer than 10 movements in 2 hours.  You have severe headaches that do not go away when you take medicine.  You have vision changes. Summary  The third trimester is from week 28 through week 40, months 7 through 9. The third trimester is a time when the unborn baby (fetus) is growing rapidly.  During the third trimester, your discomfort may increase as you and your baby continue to gain weight. You may have abdominal, leg, and back pain, sleeping problems, and an increased need to urinate.  During the third trimester your breasts will keep growing and they will continue to become tender. A yellow fluid (colostrum) may leak from your breasts. This is the first milk you are producing for your baby.  False labor is a condition in which you feel small, irregular tightenings of the muscles in the womb (contractions) that eventually go away. These are called Braxton Hicks contractions. Contractions may last for hours, days, or even weeks before true labor sets in.  Signs of labor can include: abdominal cramps; regular contractions that start at 10 minutes apart and become stronger and more frequent with time; watery or bloody mucus discharge that comes from the vagina; increased pelvic pressure and dull back pain; and leaking of amniotic fluid. This information is not intended to replace advice given to you by your  health care provider. Make sure you discuss any questions you have with your health care provider. Document Revised: 07/09/2018 Document Reviewed: 04/23/2016 Elsevier Patient Education  2020 [employer]  

## 2020-02-21 NOTE — Progress Notes (Signed)
ROB, reports no problems today. 

## 2020-02-21 NOTE — Progress Notes (Signed)
   PRENATAL VISIT NOTE  Subjective:  Ann Boyd is a 19 y.o. G2P0010 at [redacted]w[redacted]d being seen today for ongoing prenatal care.  She is currently monitored for the following issues for this low-risk pregnancy and has Supervision of normal pregnancy, antepartum; Urinary tract infection in mother during first trimester of pregnancy; Chlamydia infection affecting pregnancy in first trimester; Iron deficiency anemia of pregnancy; and Chlamydia infection affecting pregnancy in second trimester on their problem list.  Patient reports insomnia.  Contractions: Not present. Vag. Bleeding: None.  Movement: Present. Denies leaking of fluid.   The following portions of the patient's history were reviewed and updated as appropriate: allergies, current medications, past family history, past medical history, past social history, past surgical history and problem list.   Objective:   Vitals:   02/21/20 1119  BP: 116/76  Pulse: 81  Weight: 171 lb (77.6 kg)    Fetal Status: Fetal Heart Rate (bpm): 150   Movement: Present     General:  Alert, oriented and cooperative. Patient is in no acute distress.  Skin: Skin is warm and dry. No rash noted.   Cardiovascular: Normal heart rate noted  Respiratory: Normal respiratory effort, no problems with respiration noted  Abdomen: Soft, gravid, appropriate for gestational age.  Pain/Pressure: Present     Pelvic: Cervical exam deferred        Extremities: Normal range of motion.  Edema: Trace  Mental Status: Normal mood and affect. Normal behavior. Normal judgment and thought content.   Assessment and Plan:  Pregnancy: G2P0010 at [redacted]w[redacted]d 1. Encounter for supervision of normal pregnancy, antepartum, unspecified gravidity --Anticipatory guidance about next visits/weeks of pregnancy given. --Next visit in 3 weeks in office for GBS  2. Iron deficiency anemia of pregnancy --Taking oral iron, no s/sx of anemia  3. [redacted] weeks gestation of pregnancy  4. Insomnia due to  medical condition --Pt sleeping 1-2 hours in a row, sometimes only 2 hours/night.  Wakes up hungry and is having aching back pain.   -maternity support belt and warm bath/shower helps --May take Tylenol, add heating pad before bed or ice PRN --May take Benadryl occasionally to aid sleep --Reviewed sleep hygiene with pt -- Discussed increasing protein during the day and especially high protein snack before bed  Preterm labor symptoms and general obstetric precautions including but not limited to vaginal bleeding, contractions, leaking of fluid and fetal movement were reviewed in detail with the patient. Please refer to After Visit Summary for other counseling recommendations.   Return in about 3 weeks (around 03/13/2020).  Future Appointments  Date Time Provider Department Center  03/13/2020 11:15 AM Leftwich-Kirby, Wilmer Floor, CNM [facility] None    Sharen Counter, CNM

## 2020-02-29 ENCOUNTER — Other Ambulatory Visit: Payer: Self-pay

## 2020-02-29 ENCOUNTER — Inpatient Hospital Stay (HOSPITAL_COMMUNITY)
Admission: AD | Admit: 2020-02-29 | Discharge: 2020-02-29 | Disposition: A | Discharge status: Home / Self Care | Payer: Medicaid Other | Attending: Obstetrics & Gynecology | Admitting: Obstetrics & Gynecology

## 2020-02-29 ENCOUNTER — Encounter (HOSPITAL_COMMUNITY): Payer: Self-pay | Admitting: Obstetrics & Gynecology

## 2020-02-29 DIAGNOSIS — Z3A34 34 weeks gestation of pregnancy: Secondary | ICD-10-CM | POA: Diagnosis not present

## 2020-02-29 DIAGNOSIS — O99891 Other specified diseases and conditions complicating pregnancy: Secondary | ICD-10-CM

## 2020-02-29 DIAGNOSIS — O26893 Other specified pregnancy related conditions, third trimester: Secondary | ICD-10-CM | POA: Insufficient documentation

## 2020-02-29 DIAGNOSIS — R109 Unspecified abdominal pain: Secondary | ICD-10-CM

## 2020-02-29 DIAGNOSIS — M549 Dorsalgia, unspecified: Secondary | ICD-10-CM | POA: Insufficient documentation

## 2020-02-29 DIAGNOSIS — Z3689 Encounter for other specified antenatal screening: Secondary | ICD-10-CM

## 2020-02-29 DIAGNOSIS — Y92410 Unspecified street and highway as the place of occurrence of the external cause: Secondary | ICD-10-CM | POA: Diagnosis not present

## 2020-02-29 LAB — URINALYSIS, ROUTINE W REFLEX MICROSCOPIC
Bilirubin Urine: NEGATIVE
Glucose, UA: NEGATIVE mg/dL
Hgb urine dipstick: NEGATIVE
Ketones, ur: NEGATIVE mg/dL
Nitrite: NEGATIVE
Protein, ur: NEGATIVE mg/dL
Specific Gravity, Urine: 1.01 (ref 1.005–1.030)
pH: 6 (ref 5.0–8.0)

## 2020-02-29 MED ORDER — CYCLOBENZAPRINE HCL 10 MG PO TABS
10.0000 mg | ORAL_TABLET | Freq: Three times a day (TID) | ORAL | 1 refills | Status: DC | PRN
Start: 2020-02-29 — End: 2020-04-06

## 2020-02-29 NOTE — Discharge Instructions (Signed)
Back Pain in Pregnancy Back pain during pregnancy is common. Back pain may be caused by several factors that are related to changes during your pregnancy. Follow these instructions at home: Managing pain, stiffness, and swelling      If directed, for sudden (acute) back pain, put ice on the painful area. ? Put ice in a plastic bag. ? Place a towel between your skin and the bag. ? Leave the ice on for 20 minutes, 2-3 times per day.  If directed, apply heat to the affected area before you exercise. Use the heat source that your health care provider recommends, such as a moist heat pack or a heating pad. ? Place a towel between your skin and the heat source. ? Leave the heat on for 20-30 minutes. ? Remove the heat if your skin turns bright red. This is especially important if you are unable to feel pain, heat, or cold. You may have a greater risk of getting burned.  If directed, massage the affected area. Activity  Exercise as told by your health care provider. Gentle exercise is the best way to prevent or manage back pain.  Listen to your body when lifting. If lifting hurts, ask for help or bend your knees. This uses your leg muscles instead of your back muscles.  Squat down when picking up something from the floor. Do not bend over.  Only use bed rest for short periods as told by your health care provider. Bed rest should only be used for the most severe episodes of back pain. Standing, sitting, and lying down  Do not stand in one place for long periods of time.  Use good posture when sitting. Make sure your head rests over your shoulders and is not hanging forward. Use a pillow on your lower back if necessary.  Try sleeping on your side, preferably the left side, with a pregnancy support pillow or 1-2 regular pillows between your legs. ? If you have back pain after a night's rest, your bed may be too soft. ? A firm mattress may provide more support for your back during  pregnancy. General instructions  Do not wear high heels.  Eat a healthy diet. Try to gain weight within your health care provider's recommendations.  Use a maternity girdle, elastic sling, or back brace as told by your health care provider.  Take over-the-counter and prescription medicines only as told by your health care provider.  Work with a physical therapist or massage therapist to find ways to manage back pain. Acupuncture or massage therapy may be helpful.  Keep all follow-up visits as told by your health care provider. This is important. Contact a health care provider if:  Your back pain interferes with your daily activities.  You have increasing pain in other parts of your body. Get help right away if:  You develop numbness, tingling, weakness, or problems with the use of your arms or legs.  You develop severe back pain that is not controlled with medicine.  You have a change in bowel or bladder control.  You develop shortness of breath, dizziness, or you faint.  You develop nausea, vomiting, or sweating.  You have back pain that is a rhythmic, cramping pain similar to labor pains. Labor pain is usually 1-2 minutes apart, lasts for about 1 minute, and involves a bearing down feeling or pressure in your pelvis.  You have back pain and your water breaks or you have vaginal bleeding.  You have back pain or numbness  that travels down your leg.  Your back pain developed after you fell.  You develop pain on one side of your back.  You see blood in your urine.  You develop skin blisters in the area of your back pain. Summary  Back pain may be caused by several factors that are related to changes during your pregnancy.  Follow instructions as told by your health care provider for managing pain, stiffness, and swelling.  Exercise as told by your health care provider. Gentle exercise is the best way to prevent or manage back pain.  Take over-the-counter and  prescription medicines only as told by your health care provider.  Keep all follow-up visits as told by your health care provider. This is important. This information is not intended to replace advice given to you by your health care provider. Make sure you discuss any questions you have with your health care provider. Document Revised: 07/07/2018 Document Reviewed: 09/03/2017 Elsevier Patient Education  2020 [employer]  Fetal Movement Counts Patient Name: ________________________________________________ Patient Due Date: ____________________ What is a fetal movement count?  A fetal movement count is the number of times that you feel your baby move during a certain amount of time. This may also be called a fetal kick count. A fetal movement count is recommended for every pregnant woman. You may be asked to start counting fetal movements as early as week 28 of your pregnancy. Pay attention to when your baby is most active. You may notice your baby's sleep and wake cycles. You may also notice things that make your baby move more. You should do a fetal movement count:  When your baby is normally most active.  At the same time each day. A good time to count movements is while you are resting, after having something to eat and drink. How do I count fetal movements? 1. Find a quiet, comfortable area. Sit, or lie down on your side. 2. Write down the date, the start time and stop time, and the number of movements that you felt between those two times. Take this information with you to your health care visits. 3. Write down your start time when you feel the first movement. 4. Count kicks, flutters, swishes, rolls, and jabs. You should feel at least 10 movements. 5. You may stop counting after you have felt 10 movements, or if you have been counting for 2 hours. Write down the stop time. 6. If you do not feel 10 movements in 2 hours, contact your health care provider for further instructions. Your  health care provider may want to do additional tests to assess your baby's well-being. Contact a health care provider if:  You feel fewer than 10 movements in 2 hours.  Your baby is not moving like he or she usually does. Date: ____________ Start time: ____________ Stop time: ____________ Movements: ____________ Date: ____________ Start time: ____________ Stop time: ____________ Movements: ____________ Date: ____________ Start time: ____________ Stop time: ____________ Movements: ____________ Date: ____________ Start time: ____________ Stop time: ____________ Movements: ____________ Date: ____________ Start time: ____________ Stop time: ____________ Movements: ____________ Date: ____________ Start time: ____________ Stop time: ____________ Movements: ____________ Date: ____________ Start time: ____________ Stop time: ____________ Movements: ____________ Date: ____________ Start time: ____________ Stop time: ____________ Movements: ____________ Date: ____________ Start time: ____________ Stop time: ____________ Movements: ____________ This information is not intended to replace advice given to you by your health care provider. Make sure you discuss any questions you have with your health care provider. Document  Revised: 11/05/2018 Document Reviewed: 11/05/2018 Elsevier Patient Education  2020 [employer].
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
## 2020-03-13 ENCOUNTER — Other Ambulatory Visit: Payer: Self-pay

## 2020-03-13 ENCOUNTER — Other Ambulatory Visit (HOSPITAL_COMMUNITY)
Admission: RE | Admit: 2020-03-13 | Discharge: 2020-03-13 | Disposition: A | Discharge status: Home / Self Care | Payer: Medicaid Other | Source: Ambulatory Visit | Attending: Advanced Practice Midwife | Admitting: Advanced Practice Midwife

## 2020-03-13 ENCOUNTER — Ambulatory Visit (INDEPENDENT_AMBULATORY_CARE_PROVIDER_SITE_OTHER): Payer: Medicaid Other | Admitting: Advanced Practice Midwife

## 2020-03-13 VITALS — BP 123/83 | HR 83 | Wt 176.0 lb

## 2020-03-13 DIAGNOSIS — M549 Dorsalgia, unspecified: Secondary | ICD-10-CM

## 2020-03-13 DIAGNOSIS — O99891 Other specified diseases and conditions complicating pregnancy: Secondary | ICD-10-CM

## 2020-03-13 DIAGNOSIS — Z3490 Encounter for supervision of normal pregnancy, unspecified, unspecified trimester: Secondary | ICD-10-CM

## 2020-03-13 DIAGNOSIS — O98812 Other maternal infectious and parasitic diseases complicating pregnancy, second trimester: Secondary | ICD-10-CM

## 2020-03-13 DIAGNOSIS — R519 Headache, unspecified: Secondary | ICD-10-CM

## 2020-03-13 DIAGNOSIS — O99019 Anemia complicating pregnancy, unspecified trimester: Secondary | ICD-10-CM

## 2020-03-13 DIAGNOSIS — O26893 Other specified pregnancy related conditions, third trimester: Secondary | ICD-10-CM

## 2020-03-13 DIAGNOSIS — A749 Chlamydial infection, unspecified: Secondary | ICD-10-CM

## 2020-03-13 DIAGNOSIS — D509 Iron deficiency anemia, unspecified: Secondary | ICD-10-CM

## 2020-03-13 DIAGNOSIS — G479 Sleep disorder, unspecified: Secondary | ICD-10-CM

## 2020-03-13 DIAGNOSIS — Z3A36 36 weeks gestation of pregnancy: Secondary | ICD-10-CM

## 2020-03-13 NOTE — Patient Instructions (Signed)
Labor Precautions Reasons to come to MAU at [facility]:  1.  Contractions are  5 minutes apart or less, each last 1 minute, these have been going on for 1-2 hours, and you cannot walk or talk during them 2.  You have a large gush of fluid, or a trickle of fluid that will not stop and you have to wear a pad 3.  You have bleeding that is bright red, heavier than spotting--like menstrual bleeding (spotting can be normal in early labor or after a check of your cervix) 4.  You do not feel the baby moving like he/she normally does  Labor Precautions Reasons to come to [facility] at [facility]:  1.  Contractions are  5 minutes apart or less, each last 1 minute, these have been going on for 1-2 hours, and you cannot walk or talk during them 2.  You have a large gush of fluid, or a trickle of fluid that will not stop and you have to wear a pad 3.  You have bleeding that is bright red, heavier than spotting--like menstrual bleeding (spotting can be normal in early labor or after a check of your cervix) 4.  You do not feel the baby moving like he/she normally does

## 2020-03-13 NOTE — Progress Notes (Signed)
Pt notes HA today.  GBS due.  CC: back pain

## 2020-03-13 NOTE — Progress Notes (Signed)
   PRENATAL VISIT NOTE  Subjective:  Ann Boyd is a 19 y.o. G2P0010 at [redacted]w[redacted]d being seen today for ongoing prenatal care.  She is currently monitored for the following issues for this low-risk pregnancy and has Supervision of normal pregnancy, antepartum; Urinary tract infection in mother during first trimester of pregnancy; Chlamydia infection affecting pregnancy in first trimester; Iron deficiency anemia of pregnancy; and Chlamydia infection affecting pregnancy in second trimester on their problem list.  Patient reports backache and sleep disturbance.  Contractions: Irritability. Vag. Bleeding: None.  Movement: Present. Denies leaking of fluid.   The following portions of the patient's history were reviewed and updated as appropriate: allergies, current medications, past family history, past medical history, past social history, past surgical history and problem list.   Objective:   Vitals:   03/13/20 1124  BP: 123/83  Pulse: 83  Weight: 176 lb (79.8 kg)    Fetal Status: Fetal Heart Rate (bpm): 130   Movement: Present     General:  Alert, oriented and cooperative. Patient is in no acute distress.  Skin: Skin is warm and dry. No rash noted.   Cardiovascular: Normal heart rate noted  Respiratory: Normal respiratory effort, no problems with respiration noted  Abdomen: Soft, gravid, appropriate for gestational age.  Pain/Pressure: Present     Pelvic: Cervical exam deferred        Extremities: Normal range of motion.  Edema: Trace  Mental Status: Normal mood and affect. Normal behavior. Normal judgment and thought content.   Assessment and Plan:  Pregnancy: G2P0010 at [redacted]w[redacted]d 1. Encounter for supervision of normal pregnancy, antepartum, unspecified gravidity --Anticipatory guidance about next visits/weeks of pregnancy given. --Next visit in 1 week, virtual visit  2. Iron deficiency anemia of pregnancy --Taking oral iron  3. Chlamydia infection affecting pregnancy in second  trimester --Positive 6/14 with neg TOC then positive on 9/22 with negative TOC most recently on 10/11 - Strep Gp B NAA - Cervicovaginal ancillary only( [place])  4. Back pain affecting pregnancy in third trimester --MAU visit on 11/30 for back pain --Back pain improved with pregnancy support belt, heat, occasional use of Flexeril  5. [redacted] weeks gestation of pregnancy   6. Sleep disturbance --Pt reports waking frequently recently but last night did not sleep at all.   --Discussed good sleep hygiene --Sleep in daytime/nap when possible --Try Benadryl PRN, may also use Flexeril prescribed for back pain --F/U next week during virtual visit  7. Headache in pregnancy, antepartum, third trimester --Likely due to lack of sleep, BP wnl today, no other s/sx of PEC  Preterm labor symptoms and general obstetric precautions including but not limited to vaginal bleeding, contractions, leaking of fluid and fetal movement were reviewed in detail with the patient. Please refer to After Visit Summary for other counseling recommendations.   Return in about 1 week (around 03/20/2020).  No future appointments.  Sharen Counter, CNM

## 2020-03-14 LAB — CERVICOVAGINAL ANCILLARY ONLY
Chlamydia: NEGATIVE
Comment: NEGATIVE
Comment: NEGATIVE
Comment: NORMAL
Neisseria Gonorrhea: NEGATIVE
Trichomonas: NEGATIVE

## 2020-03-15 LAB — STREP GP B NAA: Strep Gp B NAA: NEGATIVE

## 2020-03-20 ENCOUNTER — Ambulatory Visit (INDEPENDENT_AMBULATORY_CARE_PROVIDER_SITE_OTHER): Payer: Medicaid Other | Admitting: Women's Health

## 2020-03-20 DIAGNOSIS — O98313 Other infections with a predominantly sexual mode of transmission complicating pregnancy, third trimester: Secondary | ICD-10-CM

## 2020-03-20 DIAGNOSIS — O98812 Other maternal infectious and parasitic diseases complicating pregnancy, second trimester: Secondary | ICD-10-CM

## 2020-03-20 DIAGNOSIS — D509 Iron deficiency anemia, unspecified: Secondary | ICD-10-CM

## 2020-03-20 DIAGNOSIS — Z3480 Encounter for supervision of other normal pregnancy, unspecified trimester: Secondary | ICD-10-CM

## 2020-03-20 DIAGNOSIS — A568 Sexually transmitted chlamydial infection of other sites: Secondary | ICD-10-CM

## 2020-03-20 DIAGNOSIS — O99013 Anemia complicating pregnancy, third trimester: Secondary | ICD-10-CM

## 2020-03-20 DIAGNOSIS — Z3A37 37 weeks gestation of pregnancy: Secondary | ICD-10-CM

## 2020-03-20 NOTE — Patient Instructions (Addendum)
Maternity Assessment Unit (MAU)  The Maternity Assessment Unit (MAU) is located at the [facility]. The address is: [address] [place], [place], [place] [address]. Please see map below for additional directions.    The Maternity Assessment Unit is designed to help you during your pregnancy, and for up to 6 weeks after delivery, with any pregnancy- or postpartum-related emergencies, if you think you are in labor, or if your water has broken. For example, if you experience nausea and vomiting, vaginal bleeding, severe abdominal or pelvic pain, elevated blood pressure or other problems related to your pregnancy or postpartum time, please come to the Maternity Assessment Unit for assistance.       AREA PEDIATRIC/FAMILY PRACTICE PHYSICIANS  [facility] [address] [place], [place] [address] Phone - [phone]   Fax - [place] [address] [place], [place]  [address] Phone - [phone]   Fax - [phone]  [place] [address] [place] [address] [place], [place]  [address] Phone - [phone]   Fax - [phone]  [facility] [address] [place], [place]  [address] Phone - [phone]   Fax - [phone]  [place] [address], [place], [place]  [address] Phone - [phone]   Fax - [place] [address] [place], [place]  [address] Phone - [phone]   Fax - [place] [facility] [place] [address], [place], [place]  [address] Phone - [phone]   Fax - [phone]  [place] [address] [place], [place], [place]  [address] Phone - [phone]   Fax - [place] [address] [place], [place]  [address] Phone - [phone]   Fax -  [phone] [facility] [place]. [address] [place], [place]  [address] Phone - [phone]   Fax - [phone]  [place] [address] [place], [place]  [address] Phone - [phone]   Fax - [phone]  [facility] [place], [place], [place]  [address] Phone - [phone]   Fax - [place] [address], [place], [place]  [address] Phone - [phone]   Fax - [phone]  [facility] [address], [place], [place]  [address] Phone - [place], [place]  [address] Phone - [phone]   Fax - [place] [address], [place], [place]  [address] Phone - [phone]   Fax - [phone]  [place] [address] [place], [place]  [address] Phone - [phone]   Fax - [place]. [place], [place]  [address] Phone - [phone]   Fax - [place], [place], [place]  [address] Phone - [phone]   Fax - [place] [address], [address] [place], [place]  [address] Phone - [phone]   Fax - [phone]  [place] RUBIN [address] [place], [place]  [address] Phone - [phone]   Fax - [place] [address] [place],   [address] Phone - [phone]  Fax - [phone]  [facility] [address] [place], [place]  [address] Phone - [phone]   Fax - [phone] Gerarda Fraction [address] [place], [place]  [address] Phone - [phone]   Fax - [phone]  [place] [address] [place], [place]  [address] Phone - [phone]   Fax - [phone]  [employer] [facility] - [place] [address] [place], [place]  [address] Phone - [phone]   Fax - [phone]          Signs and Symptoms of Labor Labor is your body's natural process of moving your baby, placenta, and umbilical cord out of your uterus. The process of labor usually starts when your baby is full-term, between 70 and 40 weeks of pregnancy. How will I know when I am close to going into labor? As your body prepares for labor and the birth of your baby, you may notice the following symptoms in the weeks and days before true labor starts:  Having a strong desire to get your home ready to receive your new baby. This is called nesting. Nesting may be a sign that labor is approaching, and it may occur several weeks before birth. Nesting may involve cleaning and organizing your home.  Passing a small amount of thick, bloody mucus out of your vagina (normal bloody show or losing your mucus plug). This may happen more than a week before labor begins, or it might occur right before labor begins as the opening of the cervix starts to widen (dilate). For some women, the entire mucus plug passes at once. For others, smaller portions of the mucus plug may gradually pass over several days.  Your baby moving (dropping) lower in your pelvis to get into position for birth (lightening). When this happens, you may feel more pressure on your bladder and pelvic bone and less pressure on your ribs. This may make it easier to breathe. It may also cause you to need to urinate more often and have problems with bowel movements.  Having "practice contractions" (Braxton Hicks contractions) that occur at irregular (unevenly spaced) intervals that are more than 10 minutes apart. This is also called false labor. False labor contractions are common after exercise or sexual activity, and they will stop if you change position, rest, or drink fluids. These contractions are usually mild and  do not get stronger over time. They may feel like: ? A backache or back pain. ? Mild cramps, similar to menstrual cramps. ? Tightening or pressure in your abdomen. Other early symptoms that labor may be starting soon include:  Nausea or loss of appetite.  Diarrhea.  Having a sudden burst of energy, or feeling very tired.  Mood changes.  Having trouble sleeping. How will I know when labor has begun? Signs that true labor has begun may include:  Having contractions that come at regular (evenly spaced) intervals and increase in intensity. This may feel like more intense tightening or pressure in your abdomen that moves to your back. ? Contractions may also feel like rhythmic pain in your upper thighs or back that comes and goes at regular intervals. ? For first-time mothers, this change in intensity of contractions often occurs at a more gradual pace. ? Women who have given birth before may notice a more rapid progression of contraction changes.  Having a feeling of pressure in the vaginal area.  Your water breaking (rupture of membranes). This is when the  sac of fluid that surrounds your baby breaks. When this happens, you will notice fluid leaking from your vagina. This may be clear or blood-tinged. Labor usually starts within 24 hours of your water breaking, but it may take longer to begin. ? Some women notice this as a gush of fluid. ? Others notice that their underwear repeatedly becomes damp. Follow these instructions at home:   When labor starts, or if your water breaks, call your health care provider or nurse care line. Based on your situation, they will determine when you should go in for an exam.  When you are in early labor, you may be able to rest and manage symptoms at home. Some strategies to try at home include: ? Breathing and relaxation techniques. ? Taking a warm bath or shower. ? Listening to music. ? Using a heating pad on the lower back for pain. If you are  directed to use heat:  Place a towel between your skin and the heat source.  Leave the heat on for 20-30 minutes.  Remove the heat if your skin turns bright red. This is especially important if you are unable to feel pain, heat, or cold. You may have a greater risk of getting burned. Get help right away if:  You have painful, regular contractions that are 5 minutes apart or less.  Labor starts before you are [redacted] weeks along in your pregnancy.  You have a fever.  You have a headache that does not go away.  You have bright red blood coming from your vagina.  You do not feel your baby moving.  You have a sudden onset of: ? Severe headache with vision problems. ? Nausea, vomiting, or diarrhea. ? Chest pain or shortness of breath. These symptoms may be an emergency. If your health care provider recommends that you go to the hospital or birth center where you plan to deliver, do not drive yourself. Have someone else drive you, or call emergency services (911 in the [place] Summary  Labor is your body's natural process of moving your baby, placenta, and umbilical cord out of your uterus.  The process of labor usually starts when your baby is full-term, between 71 and 40 weeks of pregnancy.  When labor starts, or if your water breaks, call your health care provider or nurse care line. Based on your situation, they will determine when you should go in for an exam. This information is not intended to replace advice given to you by your health care provider. Make sure you discuss any questions you have with your health care provider. Document Revised: 12/16/2016 Document Reviewed: 08/23/2016 Elsevier Patient Education  2020 Elsevier Inc.         Premature Rupture and Preterm Premature Rupture of Membranes  Rupture of membranes is when the membranes (amniotic sac) that hold your baby break open. This is commonly referred to as your "water breaking." If your water breaks before labor  starts (prematurely), it is called premature rupture of membranes (PROM). If PROM occurs before 37 weeks of pregnancy, it is called preterm premature rupture of membranes (PPROM). Because the amniotic sac keeps infection out and performs other important functions, having the amniotic sac rupture before 37 weeks of pregnancy can lead to serious problems. It requires immediate attention from a health care provider. What are the causes? When PROM occurs at 37 weeks of pregnancy or later, it is usually caused by natural weakening of the membranes and friction caused by contractions. PPROM is usually  caused by infection. In many cases, the cause is not known. What increases the risk of PPROM? The following factors may make you more likely to have PPROM: Infection. Having had PPROM in a previous pregnancy. Short cervical length. Bleeding during the second or third trimester. Low BMI, which is an estimate of body fat. Smoking. Using drugs. Low socioeconomic status. What problems can be caused by PROM and PPROM? This condition creates health dangers for the mother and the baby. These include: Delivering a premature baby. Getting a serious infection of the placental tissues (chorioamnionitis). Early detachment of the placenta from the uterus (placental abruption). Compression of the umbilical cord. Developing a serious infection after delivery. What are the signs of PROM and PPROM? Signs of this condition include: A sudden gush or slow leaking of fluid from the vagina. Constant wet underwear. Sometimes, women mistake the leaking or wetness for urine, especially if the leak is slow and not a gush of fluid. If there is constant leaking or if your underwear continues to get wet, your membranes have likely ruptured. What should I do if I think my membranes have ruptured? Call your health care provider right away. You will need to go to the hospital immediately to be checked by a health care  provider. What happens if I am diagnosed with PROM or PPROM? Once you arrive at the hospital, you will have tests done. A cervical exam will be done using a lubricated instrument (speculum) to check whether the cervix has softened or started to open (dilate). If you are diagnosed with PROM, your labor may be started for you (you may be induced) within 24 hours if you are not having contractions. If you are diagnosed with PPROM and you are not having contractions, you may be induced depending on your trimester. If you have PPROM: You and your baby will be monitored closely for signs of infection or other complications. You may be given: An antibiotic medicine to lower the chances of developing an infection. A steroid medicine to help mature the baby's lungs more quickly. A medicine to help prevent cerebral palsy in your baby. A medicine to stop preterm labor. You may be ordered to be on bed rest at home or in the hospital. You may be induced if complications occur for you or the baby. Your treatment will depend on many factors, such as how many weeks you have been pregnant (how far along you are), the development of the baby, and other complications that may occur. This information is not intended to replace advice given to you by your health care provider. Make sure you discuss any questions you have with your health care provider. Document Revised: 07/10/2018 Document Reviewed: 10/23/2015 Elsevier Patient Education  2020 [employer].

## 2020-03-20 NOTE — Progress Notes (Signed)
S/w pt for virtual visit, pt reports fetal movement with irregular contractions. Pt does not have BP cuff with her at the moment

## 2020-03-20 NOTE — Progress Notes (Signed)
I connected with Ann Boyd 03/20/20 at  3:40 PM EST by: MyChart video and verified that I am speaking with the correct person using two identifiers.  Patient is located at home and provider is located at [facility].     The purpose of this virtual visit is to provide medical care while limiting exposure to the novel coronavirus. I discussed the limitations, risks, security and privacy concerns of performing an evaluation and management service by MyChart video and the availability of in person appointments. I also discussed with the patient that there may be a patient responsible charge related to this service. By engaging in this virtual visit, you consent to the provision of healthcare.  Additionally, you authorize for your insurance to be billed for the services provided during this visit.  The patient expressed understanding and agreed to proceed.  The following staff members participated in the virtual visit:  Donia Ast    PRENATAL VISIT NOTE  Subjective:  Ann Boyd is a 19 y.o. G2P0010 at [redacted]w[redacted]d  for phone visit for ongoing prenatal care.  She is currently monitored for the following issues for this low-risk pregnancy and has Supervision of normal pregnancy, antepartum; Urinary tract infection in mother during first trimester of pregnancy; Chlamydia infection affecting pregnancy in first trimester; Iron deficiency anemia of pregnancy; and Chlamydia infection affecting pregnancy in second trimester on their problem list.  Patient reports no complaints.  Contractions: Irregular. Vag. Bleeding: None.  Movement: Present. Denies leaking of fluid.   The following portions of the patient's history were reviewed and updated as appropriate: allergies, current medications, past family history, past medical history, past social history, past surgical history and problem list.   Objective:  There were no vitals filed for this visit. Pt does not have BP cuff with her at this time, but will  take BP later today and send [place] MyChart message with reading.  Fetal Status:     Movement: Present     Assessment and Plan:  Pregnancy: G2P0010 at [redacted]w[redacted]d  1. Chlamydia infection affecting pregnancy in second trimester -TOC neg 10/11  2. Supervision of other normal pregnancy, antepartum -peds list given  3. Iron deficiency anemia of pregnancy -pt on oral iron CBC Latest Ref Rng & Units 01/10/2020 11/14/2019 09/10/2019  WBC 3.4 - 10.8 x10E3/uL 7.6 8.1 6.4  Hemoglobin 11.1 - 15.9 g/dL 10.2(L) 9.6(L) 10.4(L)  Hematocrit 34.0 - 46.6 % 31.3(L) 30.2(L) 33.6(L)  Platelets 150 - 450 x10E3/uL 206 241 234    Term labor symptoms and general obstetric precautions including but not limited to vaginal bleeding, contractions, leaking of fluid and fetal movement were reviewed in detail with the patient. I discussed the assessment and treatment plan with the patient. The patient was provided an opportunity to ask questions and all were answered. The patient agreed with the plan and demonstrated an understanding of the instructions. The patient was advised to call back or seek an in-person office evaluation/go to [facility]U at [facility] for any urgent or concerning symptoms.  Return in about 1 week (around 03/27/2020) for virtual LOB/APP OK.  Future Appointments  Date Time Provider Department Center  03/20/2020  3:40 PM Peja Allender, Odie Sera, NP [facility] None     Time spent on virtual visit: 10 minutes  Marylen Ponto, NP

## 2020-03-29 ENCOUNTER — Encounter: Payer: Self-pay | Admitting: Obstetrics & Gynecology

## 2020-03-29 ENCOUNTER — Telehealth (INDEPENDENT_AMBULATORY_CARE_PROVIDER_SITE_OTHER): Payer: Medicaid Other | Admitting: Obstetrics & Gynecology

## 2020-03-29 VITALS — BP 137/85 | HR 84

## 2020-03-29 DIAGNOSIS — O98812 Other maternal infectious and parasitic diseases complicating pregnancy, second trimester: Secondary | ICD-10-CM

## 2020-03-29 DIAGNOSIS — Z3A38 38 weeks gestation of pregnancy: Secondary | ICD-10-CM

## 2020-03-29 DIAGNOSIS — O99013 Anemia complicating pregnancy, third trimester: Secondary | ICD-10-CM

## 2020-03-29 DIAGNOSIS — O2343 Unspecified infection of urinary tract in pregnancy, third trimester: Secondary | ICD-10-CM

## 2020-03-29 DIAGNOSIS — D509 Iron deficiency anemia, unspecified: Secondary | ICD-10-CM

## 2020-03-29 DIAGNOSIS — U071 COVID-19: Secondary | ICD-10-CM

## 2020-03-29 DIAGNOSIS — O98313 Other infections with a predominantly sexual mode of transmission complicating pregnancy, third trimester: Secondary | ICD-10-CM

## 2020-03-29 DIAGNOSIS — O99019 Anemia complicating pregnancy, unspecified trimester: Secondary | ICD-10-CM

## 2020-03-29 DIAGNOSIS — O2341 Unspecified infection of urinary tract in pregnancy, first trimester: Secondary | ICD-10-CM

## 2020-03-29 DIAGNOSIS — O98513 Other viral diseases complicating pregnancy, third trimester: Secondary | ICD-10-CM

## 2020-03-29 DIAGNOSIS — Z3480 Encounter for supervision of other normal pregnancy, unspecified trimester: Secondary | ICD-10-CM

## 2020-03-29 DIAGNOSIS — A568 Sexually transmitted chlamydial infection of other sites: Secondary | ICD-10-CM

## 2020-03-29 NOTE — Progress Notes (Signed)
Pt states she tested positive for Covid yesterday.   Pt has not had Covid vaccine.

## 2020-03-29 NOTE — Progress Notes (Signed)
OBSTETRICS PRENATAL VIRTUAL VISIT ENCOUNTER NOTE  Provider location: Center for [facility]   I connected with Ann Boyd on 03/29/20 at  1:15 PM EST by MyChart Video Encounter at home and verified that I am speaking with the correct person using two identifiers.   I discussed the limitations, risks, security and privacy concerns of performing an evaluation and management service virtually and the availability of in person appointments. I also discussed with the patient that there may be a patient responsible charge related to this service. The patient expressed understanding and agreed to proceed. Subjective:  Ann Boyd is a 19 y.o. G2P0010 at [redacted]w[redacted]d being seen today for ongoing prenatal care.  She is currently monitored for the following issues for this high-risk pregnancy and has Supervision of normal pregnancy, antepartum; Urinary tract infection in mother during first trimester of pregnancy; Chlamydia infection affecting pregnancy in first trimester; Iron deficiency anemia of pregnancy; and Chlamydia infection affecting pregnancy in second trimester on their problem list.  Patient reports that she just tested POS yesterday for COVID. She has had fevers and chills. Has not checked her temp today. She is having nausea and emesis but ate soup prev that she kept down. She denies any difficulty breathing. She has occ cough. She feels more like she has a bad cold. Pt does not have a thermometer at home. Pt has had sx a total of 4 days. Pt does not feel like her sx are worse today. She feels like she is at a standstill. She is resting and drinking tea. Contractions: Not present. Vag. Bleeding: None.  Movement: Present. Denies any leaking of fluid.   The following portions of the patient's history were reviewed and updated as appropriate: allergies, current medications, past family history, past medical history, past social history, past surgical history and problem list.    Objective:   Vitals:   03/29/20 1309  BP: 137/85  Pulse: 84    Fetal Status:     Movement: Present     General:  Alert, oriented and cooperative. Patient is in no acute distress. Pt appeared only slightly ill.    Respiratory: Normal respiratory effort, no problems with respiration noted; thee was not noted coughing. Pt spoke in normal cadence and did not exhibit any difficulty breathing or SOB.    Mental Status: Normal mood and affect. Normal behavior. Normal judgment and thought content.  Rest of physical exam deferred due to type of encounter  Imaging: No results found.  Assessment and Plan:  Pregnancy: G2P0010 at [redacted]w[redacted]d 1. Chlamydia infection affecting pregnancy in second trimester tx'd  TOC neg  2. Supervision of other normal pregnancy, antepartum  3. Iron deficiency anemia of pregnancy  4. Urinary tract infection in mother during first trimester of pregnancy  5. COVID +  Tested pos 12/28. Developed sx on Dec 26th.  Reviewed the risk of severe disease to COVID and pregnancy. Pt counseling to f/u if she developed any SOB or DOE. Pt counseled to obtain a thermometer to check her temps.   Term labor symptoms and general obstetric precautions including but not limited to vaginal bleeding, contractions, leaking of fluid and fetal movement were reviewed in detail with the patient. I discussed the assessment and treatment plan with the patient. The patient was provided an opportunity to ask questions and all were answered. The patient agreed with the plan and demonstrated an understanding of the instructions. The patient was advised to call back or seek an in-person office evaluation/go  to [facility]U at [facility] for any urgent or concerning symptoms. Please refer to After Visit Summary for other counseling recommendations.   I provided 15 minutes of face-to-face time during this encounter.  No follow-ups on file.  No future appointments.  Willodean Rosenthal, MD Center for [employer], [facility]

## 2020-04-01 ENCOUNTER — Inpatient Hospital Stay (HOSPITAL_COMMUNITY)
Admission: AD | Admit: 2020-04-01 | Discharge: 2020-04-04 | DRG: 805 | Disposition: A | Discharge status: Home / Self Care | Payer: No Typology Code available for payment source | Attending: Obstetrics & Gynecology | Admitting: Obstetrics & Gynecology

## 2020-04-01 ENCOUNTER — Encounter (HOSPITAL_COMMUNITY): Payer: Self-pay | Admitting: Obstetrics & Gynecology

## 2020-04-01 ENCOUNTER — Other Ambulatory Visit: Payer: Self-pay

## 2020-04-01 DIAGNOSIS — O4202 Full-term premature rupture of membranes, onset of labor within 24 hours of rupture: Secondary | ICD-10-CM | POA: Diagnosis not present

## 2020-04-01 DIAGNOSIS — R030 Elevated blood-pressure reading, without diagnosis of hypertension: Secondary | ICD-10-CM | POA: Diagnosis present

## 2020-04-01 DIAGNOSIS — U071 COVID-19: Secondary | ICD-10-CM | POA: Diagnosis present

## 2020-04-01 DIAGNOSIS — R438 Other disturbances of smell and taste: Secondary | ICD-10-CM | POA: Diagnosis present

## 2020-04-01 DIAGNOSIS — D62 Acute posthemorrhagic anemia: Secondary | ICD-10-CM | POA: Diagnosis not present

## 2020-04-01 DIAGNOSIS — O9081 Anemia of the puerperium: Secondary | ICD-10-CM | POA: Diagnosis not present

## 2020-04-01 DIAGNOSIS — O133 Gestational [pregnancy-induced] hypertension without significant proteinuria, third trimester: Secondary | ICD-10-CM

## 2020-04-01 DIAGNOSIS — O98513 Other viral diseases complicating pregnancy, third trimester: Secondary | ICD-10-CM | POA: Diagnosis not present

## 2020-04-01 DIAGNOSIS — O134 Gestational [pregnancy-induced] hypertension without significant proteinuria, complicating childbirth: Principal | ICD-10-CM | POA: Diagnosis present

## 2020-04-01 DIAGNOSIS — O9852 Other viral diseases complicating childbirth: Secondary | ICD-10-CM | POA: Diagnosis present

## 2020-04-01 DIAGNOSIS — O8612 Endometritis following delivery: Secondary | ICD-10-CM | POA: Diagnosis not present

## 2020-04-01 DIAGNOSIS — O2341 Unspecified infection of urinary tract in pregnancy, first trimester: Secondary | ICD-10-CM | POA: Diagnosis present

## 2020-04-01 DIAGNOSIS — Z3A39 39 weeks gestation of pregnancy: Secondary | ICD-10-CM | POA: Diagnosis not present

## 2020-04-01 DIAGNOSIS — A749 Chlamydial infection, unspecified: Secondary | ICD-10-CM | POA: Diagnosis present

## 2020-04-01 DIAGNOSIS — O139 Gestational [pregnancy-induced] hypertension without significant proteinuria, unspecified trimester: Secondary | ICD-10-CM | POA: Diagnosis not present

## 2020-04-01 LAB — CBC
HCT: 35.7 % — ABNORMAL LOW (ref 36.0–46.0)
Hemoglobin: 11.3 g/dL — ABNORMAL LOW (ref 12.0–15.0)
MCH: 27.1 pg (ref 26.0–34.0)
MCHC: 31.7 g/dL (ref 30.0–36.0)
MCV: 85.6 fL (ref 80.0–100.0)
Platelets: 182 10*3/uL (ref 150–400)
RBC: 4.17 MIL/uL (ref 3.87–5.11)
RDW: 13.5 % (ref 11.5–15.5)
WBC: 8.7 10*3/uL (ref 4.0–10.5)
nRBC: 0 % (ref 0.0–0.2)

## 2020-04-01 MED ORDER — LACTATED RINGERS IV SOLN: 500.0000 mL | INTRAVENOUS | Status: DC | PRN

## 2020-04-01 MED ORDER — MISOPROSTOL 50MCG HALF TABLET
50.0000 ug | ORAL_TABLET | ORAL | Status: DC | PRN
  Administered 2020-04-02: 50 ug via BUCCAL
  Filled 2020-04-01: qty 1

## 2020-04-01 MED ORDER — ACETAMINOPHEN 325 MG PO TABS: 650.0000 mg | ORAL_TABLET | ORAL | Status: DC | PRN

## 2020-04-01 MED ORDER — SOD CITRATE-CITRIC ACID 500-334 MG/5ML PO SOLN: 30.0000 mL | ORAL | Status: DC | PRN

## 2020-04-01 MED ORDER — FENTANYL CITRATE (PF) 100 MCG/2ML IJ SOLN
100.0000 ug | INTRAMUSCULAR | Status: DC | PRN
Start: 2020-04-01 — End: 2020-04-02

## 2020-04-01 MED ORDER — LIDOCAINE HCL (PF) 1 % IJ SOLN
30.0000 mL | INTRAMUSCULAR | Status: AC | PRN
  Administered 2020-04-02: 5 mL via SUBCUTANEOUS
  Filled 2020-04-01: qty 30

## 2020-04-01 MED ORDER — OXYTOCIN BOLUS FROM INFUSION
333.0000 mL | Freq: Once | INTRAVENOUS | Status: AC
  Administered 2020-04-02: 333 mL via INTRAVENOUS

## 2020-04-01 MED ORDER — OXYCODONE-ACETAMINOPHEN 5-325 MG PO TABS: 2.0000 | ORAL_TABLET | ORAL | Status: DC | PRN

## 2020-04-01 MED ORDER — OXYCODONE-ACETAMINOPHEN 5-325 MG PO TABS: 1.0000 | ORAL_TABLET | ORAL | Status: DC | PRN

## 2020-04-01 MED ORDER — TERBUTALINE SULFATE 1 MG/ML IJ SOLN: 0.2500 mg | Freq: Once | INTRAMUSCULAR | Status: DC | PRN

## 2020-04-01 MED ORDER — LACTATED RINGERS IV SOLN: INTRAVENOUS | Status: DC

## 2020-04-01 MED ORDER — OXYTOCIN-SODIUM CHLORIDE 30-0.9 UT/500ML-% IV SOLN
2.5000 [IU]/h | INTRAVENOUS | Status: DC
  Filled 2020-04-01: qty 500

## 2020-04-01 MED ORDER — ONDANSETRON HCL 4 MG/2ML IJ SOLN: 4.0000 mg | Freq: Four times a day (QID) | INTRAMUSCULAR | Status: DC | PRN

## 2020-04-01 NOTE — MAU Note (Signed)
.  Ann Boyd is a 20 y.o. at [redacted]w[redacted]d here in MAU reporting: vaginal pressure that began today. Patient is unsure if she is having contractions. States she has felt baby move less ever since she started being sick.

## 2020-04-01 NOTE — MAU Provider Note (Signed)
Event Date/Time   First Provider Initiated Contact with Patient 04/01/20 2244     S: Ann Boyd is a 20 y.o. G2P0010 at [redacted]w[redacted]d  who presents to MAU today complaining of irregular contractions over the past few days and increased vaginal pressure today. She also reports decreased fetal movement since she tested positive for Covid two days ago. She denies vaginal bleeding. She denies LOF.   O: BP (!) 137/95   Pulse 84   Temp 98.1 F (36.7 C) (Oral)   Resp 18   LMP 07/01/2019 (Exact Date)   SpO2 99%  GENERAL: Well-developed, well-nourished female in no acute distress.  HEAD: Normocephalic, atraumatic.  CHEST: Normal effort of breathing, regular heart rate ABDOMEN: Soft, nontender, gravid  Cervical exam:  Dilation: 1.5 Effacement (%): 80 Cervical Position: Posterior Station: 0 Presentation: Vertex Exam by:: weston,rn   Fetal Monitoring: reactive Baseline: 130 Variability: moderate Accelerations: present Decelerations: none Contractions: irregular q23min   A: SIUP at [redacted]w[redacted]d  Possibly latent labor  Gestational hypertension (two elevated pressures in MAU today, 137/85 on 12/29)  P: Admit to L&D for IOL for gHTN Report given to Philipp Deputy, CNM who will assume care.  Bernerd Limbo, [place] 04/01/2020 10:45 PM

## 2020-04-01 NOTE — L&D Delivery Note (Signed)
Delivery Note Pushed x 34 minutes. At 9:54 AM a viable and healthy female was delivered via Vaginal, Spontaneous (Presentation:  ROA restituted to ROP).  APGAR: 9, 10; weight  pending. Pitocin bolus started at 1000 after delivery of baby. Moderate gush of blood noted accompanied by frank pus. As CNM went to manually remove placenta it was found to be in the vagina and delivered spontaneously. Scant bleeding following. Buccal Cytotec 800 mg given immediately after.  Placenta status: Spontaneous, Intact.  Cord: 3 vessels with the following complications: None.  Cord pH: NA.   Uncertain cause of purulent drainage. Pt w/out signs of Triple I. Unsure of exact ROM time but around 1 hour prior to delivery. Asked pt if she had any concern for ROM earlier and she denied. No fever, maternal or fetal tachycardia or leukocytosis. Fundus NT. Pt did have Chlamydia in September 2021 and neg TOC x 2. Will consult w/ attending and CTO for Sx endometritis PP. CNM unaware of this being associated w/ Covid.   Anesthesia: Epidural Episiotomy: None Lacerations: 2nd degree Suture Repair: 3.0 vicryl rapide Est. Blood Loss (mL): 981  Mom to postpartum.  Baby to Couplet care / Skin to Skin. Placenta to: Pathology Feeding: Br/Bo Circ: NA Contraception: POPs   [place] 04/02/2020, 10:49 AM

## 2020-04-02 ENCOUNTER — Encounter (HOSPITAL_COMMUNITY): Payer: Self-pay | Admitting: Obstetrics & Gynecology

## 2020-04-02 ENCOUNTER — Inpatient Hospital Stay (HOSPITAL_COMMUNITY): Payer: No Typology Code available for payment source | Admitting: Anesthesiology

## 2020-04-02 DIAGNOSIS — Z3A39 39 weeks gestation of pregnancy: Secondary | ICD-10-CM

## 2020-04-02 DIAGNOSIS — O4202 Full-term premature rupture of membranes, onset of labor within 24 hours of rupture: Secondary | ICD-10-CM

## 2020-04-02 DIAGNOSIS — O98513 Other viral diseases complicating pregnancy, third trimester: Secondary | ICD-10-CM

## 2020-04-02 DIAGNOSIS — U071 COVID-19: Secondary | ICD-10-CM

## 2020-04-02 DIAGNOSIS — O139 Gestational [pregnancy-induced] hypertension without significant proteinuria, unspecified trimester: Secondary | ICD-10-CM | POA: Diagnosis not present

## 2020-04-02 LAB — PROTEIN / CREATININE RATIO, URINE
Creatinine, Urine: 70.03 mg/dL
Protein Creatinine Ratio: 0.17 mg/mg{Cre} — ABNORMAL HIGH (ref 0.00–0.15)
Total Protein, Urine: 12 mg/dL

## 2020-04-02 LAB — COMPREHENSIVE METABOLIC PANEL
ALT: 13 U/L (ref 0–44)
AST: 21 U/L (ref 15–41)
Albumin: 2.9 g/dL — ABNORMAL LOW (ref 3.5–5.0)
Alkaline Phosphatase: 308 U/L — ABNORMAL HIGH (ref 38–126)
Anion gap: 10 (ref 5–15)
BUN: 7 mg/dL (ref 6–20)
CO2: 20 mmol/L — ABNORMAL LOW (ref 22–32)
Calcium: 8.9 mg/dL (ref 8.9–10.3)
Chloride: 105 mmol/L (ref 98–111)
Creatinine, Ser: 0.54 mg/dL (ref 0.44–1.00)
GFR, Estimated: >60 mL/min (ref >60)
Glucose, Bld: 85 mg/dL (ref 70–99)
Potassium: 4 mmol/L (ref 3.5–5.1)
Sodium: 135 mmol/L (ref 135–145)
Total Bilirubin: 0.5 mg/dL (ref 0.3–1.2)
Total Protein: 6.6 g/dL (ref 6.5–8.1)

## 2020-04-02 LAB — RESP PANEL BY RT-PCR (FLU A&B, COVID) ARPGX2
Influenza A by PCR: NEGATIVE
Influenza B by PCR: NEGATIVE
SARS Coronavirus 2 by RT PCR: POSITIVE — AB

## 2020-04-02 LAB — TYPE AND SCREEN
ABO/RH(D): O POS
Antibody Screen: NEGATIVE

## 2020-04-02 LAB — RPR
RPR Ser Ql: REACTIVE — AB
RPR Titer: 1:1 {titer}

## 2020-04-02 MED ORDER — DIPHENHYDRAMINE HCL 50 MG/ML IJ SOLN: 12.5000 mg | INTRAMUSCULAR | Status: DC | PRN

## 2020-04-02 MED ORDER — GENTAMICIN SULFATE 40 MG/ML IJ SOLN
5.0000 mg/kg | INTRAVENOUS | Status: DC
  Administered 2020-04-02 – 2020-04-03 (×2): 330 mg via INTRAVENOUS
  Filled 2020-04-02 (×3): qty 8.25

## 2020-04-02 MED ORDER — EPHEDRINE 5 MG/ML INJ: 10.0000 mg | INTRAVENOUS | Status: DC | PRN

## 2020-04-02 MED ORDER — WITCH HAZEL-GLYCERIN EX PADS: 1.0000 "application " | MEDICATED_PAD | CUTANEOUS | Status: DC | PRN

## 2020-04-02 MED ORDER — MEDROXYPROGESTERONE ACETATE 150 MG/ML IM SUSP
150.0000 mg | Freq: Once | INTRAMUSCULAR | Status: AC
  Administered 2020-04-04: 150 mg via INTRAMUSCULAR
  Filled 2020-04-02: qty 1

## 2020-04-02 MED ORDER — ZOLPIDEM TARTRATE 5 MG PO TABS: 5.0000 mg | ORAL_TABLET | Freq: Every evening | ORAL | Status: DC | PRN

## 2020-04-02 MED ORDER — MEASLES, MUMPS & RUBELLA VAC IJ SOLR: 0.5000 mL | Freq: Once | INTRAMUSCULAR | Status: DC

## 2020-04-02 MED ORDER — TETANUS-DIPHTH-ACELL PERTUSSIS 5-2.5-18.5 LF-MCG/0.5 IM SUSY: 0.5000 mL | PREFILLED_SYRINGE | Freq: Once | INTRAMUSCULAR | Status: DC

## 2020-04-02 MED ORDER — PHENYLEPHRINE 40 MCG/ML (10ML) SYRINGE FOR IV PUSH (FOR BLOOD PRESSURE SUPPORT)
80.0000 ug | PREFILLED_SYRINGE | INTRAVENOUS | Status: DC | PRN
  Filled 2020-04-02: qty 10

## 2020-04-02 MED ORDER — LACTATED RINGERS IV SOLN
500.0000 mL | Freq: Once | INTRAVENOUS | Status: AC
  Administered 2020-04-02: 500 mL via INTRAVENOUS

## 2020-04-02 MED ORDER — ACETAMINOPHEN 325 MG PO TABS
650.0000 mg | ORAL_TABLET | ORAL | Status: DC | PRN
  Administered 2020-04-02: 650 mg via ORAL
  Filled 2020-04-02: qty 2

## 2020-04-02 MED ORDER — OXYCODONE-ACETAMINOPHEN 5-325 MG PO TABS: 2.0000 | ORAL_TABLET | ORAL | Status: DC | PRN

## 2020-04-02 MED ORDER — DIBUCAINE (PERIANAL) 1 % EX OINT: 1.0000 "application " | TOPICAL_OINTMENT | CUTANEOUS | Status: DC | PRN

## 2020-04-02 MED ORDER — DIPHENHYDRAMINE HCL 25 MG PO CAPS: 25.0000 mg | ORAL_CAPSULE | Freq: Four times a day (QID) | ORAL | Status: DC | PRN

## 2020-04-02 MED ORDER — SODIUM CHLORIDE (PF) 0.9 % IJ SOLN
INTRAMUSCULAR | Status: DC | PRN
  Administered 2020-04-02: 12 mL/h via EPIDURAL

## 2020-04-02 MED ORDER — MAGNESIUM HYDROXIDE 400 MG/5ML PO SUSP: 30.0000 mL | ORAL | Status: DC | PRN

## 2020-04-02 MED ORDER — LIDOCAINE HCL (PF) 1 % IJ SOLN
INTRAMUSCULAR | Status: DC | PRN
  Administered 2020-04-02: 2 mL via EPIDURAL
  Administered 2020-04-02: 10 mL via EPIDURAL

## 2020-04-02 MED ORDER — IBUPROFEN 600 MG PO TABS
600.0000 mg | ORAL_TABLET | Freq: Four times a day (QID) | ORAL | Status: DC
  Administered 2020-04-02 – 2020-04-04 (×9): 600 mg via ORAL
  Filled 2020-04-02 (×9): qty 1

## 2020-04-02 MED ORDER — ONDANSETRON HCL 4 MG PO TABS: 4.0000 mg | ORAL_TABLET | ORAL | Status: DC | PRN

## 2020-04-02 MED ORDER — OXYCODONE-ACETAMINOPHEN 5-325 MG PO TABS: 1.0000 | ORAL_TABLET | ORAL | Status: DC | PRN

## 2020-04-02 MED ORDER — FENTANYL-BUPIVACAINE-NACL 0.5-0.125-0.9 MG/250ML-% EP SOLN
12.0000 mL/h | EPIDURAL | Status: DC | PRN
  Filled 2020-04-02: qty 250

## 2020-04-02 MED ORDER — PHENYLEPHRINE 40 MCG/ML (10ML) SYRINGE FOR IV PUSH (FOR BLOOD PRESSURE SUPPORT): 80.0000 ug | PREFILLED_SYRINGE | INTRAVENOUS | Status: DC | PRN

## 2020-04-02 MED ORDER — COCONUT OIL OIL: 1.0000 "application " | TOPICAL_OIL | Status: DC | PRN

## 2020-04-02 MED ORDER — FERROUS SULFATE 325 (65 FE) MG PO TABS
325.0000 mg | ORAL_TABLET | Freq: Two times a day (BID) | ORAL | Status: DC
  Administered 2020-04-02 – 2020-04-04 (×4): 325 mg via ORAL
  Filled 2020-04-02 (×4): qty 1

## 2020-04-02 MED ORDER — CLINDAMYCIN PHOSPHATE 900 MG/50ML IV SOLN
900.0000 mg | Freq: Three times a day (TID) | INTRAVENOUS | Status: DC
  Administered 2020-04-02 – 2020-04-03 (×4): 900 mg via INTRAVENOUS
  Filled 2020-04-02 (×6): qty 50

## 2020-04-02 MED ORDER — SIMETHICONE 80 MG PO CHEW: 80.0000 mg | CHEWABLE_TABLET | ORAL | Status: DC | PRN

## 2020-04-02 MED ORDER — MISOPROSTOL 200 MCG PO TABS
ORAL_TABLET | ORAL | Status: AC
  Administered 2020-04-02: 800 ug via BUCCAL
  Filled 2020-04-02: qty 4

## 2020-04-02 MED ORDER — CLINDAMYCIN PHOSPHATE 900 MG/50ML IV SOLN
900.0000 mg | Freq: Once | INTRAVENOUS | Status: AC
  Administered 2020-04-02: 900 mg via INTRAVENOUS
  Filled 2020-04-02: qty 50

## 2020-04-02 MED ORDER — BENZOCAINE-MENTHOL 20-0.5 % EX AERO
1.0000 "application " | INHALATION_SPRAY | CUTANEOUS | Status: DC | PRN
  Administered 2020-04-02: 1 via TOPICAL
  Filled 2020-04-02: qty 56

## 2020-04-02 MED ORDER — ONDANSETRON HCL 4 MG/2ML IJ SOLN: 4.0000 mg | INTRAMUSCULAR | Status: DC | PRN

## 2020-04-02 MED ORDER — PRENATAL MULTIVITAMIN CH
1.0000 | ORAL_TABLET | Freq: Every day | ORAL | Status: DC
  Administered 2020-04-02 – 2020-04-04 (×3): 1 via ORAL
  Filled 2020-04-02 (×3): qty 1

## 2020-04-02 NOTE — Anesthesia Procedure Notes (Signed)
Epidural Patient location during procedure: OB Start time: 04/02/2020 3:27 AM End time: 04/02/2020 3:42 AM  Staffing Anesthesiologist: Lannie Fields, DO Performed: anesthesiologist   Preanesthetic Checklist Completed: patient identified, IV checked, risks and benefits discussed, monitors and equipment checked, pre-op evaluation and timeout performed  Epidural Patient position: sitting Prep: DuraPrep and site prepped and draped Patient monitoring: continuous pulse ox, blood pressure, heart rate and cardiac monitor Approach: midline Location: L3-L4 Injection technique: LOR air  Needle:  Needle type: Tuohy  Needle gauge: 17 G Needle length: 9 cm Needle insertion depth: 7 cm Catheter type: closed end flexible Catheter size: 19 Gauge Catheter at skin depth: 12 cm Test dose: negative  Assessment Sensory level: T8 Events: blood not aspirated, injection not painful, no injection resistance, no paresthesia and negative IV test  Additional Notes Patient identified. Risks/Benefits/Options discussed with patient including but not limited to bleeding, infection, nerve damage, paralysis, failed block, incomplete pain control, headache, blood pressure changes, nausea, vomiting, reactions to medication both or allergic, itching and postpartum back pain. Confirmed with bedside nurse the patient's most recent platelet count. Confirmed with patient that they are not currently taking any anticoagulation, have any bleeding history or any family history of bleeding disorders. Patient expressed understanding and wished to proceed. All questions were answered. Sterile technique was used throughout the entire procedure. Please see nursing notes for vital signs. Test dose was given through epidural catheter and negative prior to continuing to dose epidural or start infusion. Warning signs of high block given to the patient including shortness of breath, tingling/numbness in hands, complete motor block,  or any concerning symptoms with instructions to call for help. Patient was given instructions on fall risk and not to get out of bed. All questions and concerns addressed with instructions to call with any issues or inadequate analgesia.  Reason for block:procedure for pain

## 2020-04-02 NOTE — Progress Notes (Signed)
Provider relayed Plan of Care to place cytotec again when next dose is due. Provider is aware of patient effacement.

## 2020-04-02 NOTE — Discharge Summary (Signed)
Postpartum Discharge Summary    Patient Name: Ann Boyd DOB: 2000/09/16 MRN: [id number]  Date of admission: 04/01/2020 Delivery date:04/02/2020  Delivering provider: Manya Silvas  Date of discharge: 04/04/2020  Admitting diagnosis: Elevated blood pressure reading without diagnosis of hypertension [R03.0] Intrauterine pregnancy: [redacted]w[redacted]d    Secondary diagnosis:  Active Problems:   Urinary tract infection in mother during first trimester of pregnancy   Chlamydia infection affecting pregnancy in first trimester   Chlamydia infection affecting pregnancy in second trimester   Gestational hypertension   Vaginal delivery   COVID-19 affecting pregnancy in third trimester   Additional problems:     Discharge diagnosis: Term Pregnancy Delivered and Gestational Hypertension                                              Post partum procedures:none Augmentation: Cytotec Complications: None  Hospital course: Induction of Labor With Vaginal Delivery   20y.o. yo G2P0010 at 381w3das admitted to the hospital 04/01/2020 for induction of labor.  Indication for induction: Gestational hypertension.  Patient had an uncomplicated labor course as follows: Membrane Rupture Time/Date: 9:20 AM ,04/02/2020   Delivery Method:Vaginal, Spontaneous  Episiotomy: None  Lacerations:  2nd degree  Details of delivery can be found in separate delivery note. Frank pus was noted at time of delivery of placenta. GC/Chlamydia cultures collected due to pt's Hx of Chlamydia this pregnancy. Will Tx accordingly. Patient had an uncomplicated postpartum course. She was started on procardia 30 mg for elevated BP and BP was normal at time of discharge. She remained asymptomatic in regards to her Covid positive status. Patient is discharged home 04/04/20.  Newborn Data: Birth date:04/02/2020  Birth time:9:54 AM  Gender:Female  Living status:Living  Apgars:9 ,10  Weight:3050 g    Magnesium Sulfate received: No BMZ received:  No Rhophylac:N/A MMR:N/A T-DaP:Given prenatally Flu: Yes Transfusion:No  Physical exam  Vitals:   04/03/20 1030 04/03/20 1445 04/03/20 2218 04/04/20 0651  BP: 133/88 118/89 128/74 93/79  Pulse: 80 84 89 84  Resp: _0 Temp: (!) 97.2 F (36.2 C) 97.7 F (36.5 C) 99 F (37.2 C) 98.2 F (36.8 C)  TempSrc: Oral Oral Oral Oral  SpO2: 99% 99% 97% 99%  Weight:      Height:       General: alert, cooperative and no distress Lochia: appropriate Uterine Fundus: firm Incision: Healing well with no significant drainage DVT Evaluation: No evidence of DVT seen on physical exam. Labs: Lab Results  Component Value Date   WBC 12.9 (H) 04/03/2020   HGB 8.9 (L) 04/03/2020   HCT 27.6 (L) 04/03/2020   MCV 84.1 04/03/2020   PLT 153 04/03/2020   CMP Latest Ref Rng & Units 04/01/2020  Glucose 70 - 99 mg/dL 85  BUN 6 - 20 mg/dL 7  Creatinine 0.44 - 1.00 mg/dL 0.54  Sodium 135 - 145 mmol/L 135  Potassium 3.5 - 5.1 mmol/L 4.0  Chloride 98 - 111 mmol/L 105  CO2 22 - 32 mmol/L 20(L)  Calcium 8.9 - 10.3 mg/dL 8.9  Total Protein 6.5 - 8.1 g/dL 6.6  Total Bilirubin 0.3 - 1.2 mg/dL 0.5  Alkaline Phos 38 - 126 U/L 308(H)  AST 15 - 41 U/L 21  ALT 0 - 44 U/L 13   Edinburgh Score: Edinburgh Postnatal Depression Scale Screening Tool  04/02/2020  I have been able to laugh and see the funny side of things. 0  I have looked forward with enjoyment to things. 0  I have blamed myself unnecessarily when things went wrong. 0  I have been anxious or worried for no good reason. 2  I have felt scared or panicky for no good reason. 0  Things have been getting on top of me. 0  I have been so unhappy that I have had difficulty sleeping. 0  I have felt sad or miserable. 0  I have been so unhappy that I have been crying. 0  The thought of harming myself has occurred to me. 0  Edinburgh Postnatal Depression Scale Total 2     After visit meds:  Allergies as of 04/04/2020   No Known Allergies      Medication List    TAKE these medications   acetaminophen 325 MG tablet Commonly known as: Tylenol Take 2 tablets (650 mg total) by mouth every 4 (four) hours as needed (for pain scale < 4).   [place] Supp Sm Misc Wear as directed.   cyclobenzaprine 10 MG tablet Commonly known as: FLEXERIL Take 1 tablet (10 mg total) by mouth every 8 (eight) hours as needed for muscle spasms.   ferrous gluconate 324 MG tablet Commonly known as: FERGON Take 1 tablet (324 mg total) by mouth daily with breakfast.   ibuprofen 600 MG tablet Commonly known as: ADVIL Take 1 tablet (600 mg total) by mouth every 6 (six) hours.   NIFEdipine 30 MG 24 hr tablet Commonly known as: PROCARDIA-XL/NIFEDICAL-XL Take 1 tablet (30 mg total) by mouth daily.   Prenate Mini 18-0.6-0.4-350 MG Caps Take 1 capsule by mouth daily.        Discharge home in stable condition Infant Feeding: Bottle and Breast Infant Disposition:home with mother Discharge instruction: per After Visit Summary and Postpartum booklet. Activity: Advance as tolerated. Pelvic rest for 6 weeks.  Diet: routine diet Future Appointments: Future Appointments  Date Time Provider [place]  04/06/2020  9:15 AM [place] None  05/01/2020 10:55 AM [place], Kathie Dike, CNM [facility] None   Follow up Visit:   Please schedule this patient for a In person postpartum visit in 4 weeks with the following provider: Any provider. Additional Postpartum F/U:BP check 2-3 days (Virtual  Pt is Covid +) High risk pregnancy complicated by: GHTN Delivery mode:  Vaginal, Spontaneous  Anticipated Birth Control:  POPs   04/04/2020 Janet Berlin, MD

## 2020-04-02 NOTE — Progress Notes (Addendum)
SVD baby girl skin to skin with mother 

## 2020-04-02 NOTE — H&P (Signed)
Ann Boyd is a 20 y.o. female G2P0010 @ 39.3wks by LMP c/w 6wk scan presenting for IOL due to elevated BPs in MAU without dx of gHTN. Denies H/A, N/V or visual disturbances; no s/s pre-e. No leaking or bldg. She had a +Covid test on 12/28 after becoming symptomatic on 12/26 (chills & dry cough). Currently her only symptoms are a loss of taste/smell; no congestion, fever, or SOB. She reports less FM than previously but she is meeting the FM protocol. Her preg has been followed by the [facility] service and her preg has been remarkable for:  # +chlamydia x 2 in preg with neg TOCs # UTI in first tri with neg TOC # Covid+ 12/28 (symptoms began 12/26)  OB History    Gravida  2   Para      Term      Preterm      AB  1   Living  0     SAB      IAB      Ectopic      Multiple  0   Live Births             Past Medical History:  Diagnosis Date  . Hx of chlamydia infection 09/2018  . Hx of gonorrhea 09/2018  . Hx of trichomoniasis 09/2017  . Medical history non-contributory    Past Surgical History:  Procedure Laterality Date  . NO PAST SURGERIES     Family History: family history includes ADD / ADHD in her brother; Healthy in her father and mother; Hypertension in her mother. Social History:  reports that she has never smoked. She has never used smokeless tobacco. She reports that she does not drink alcohol and does not use drugs.     Maternal Diabetes: No Genetic Screening: Normal Maternal Ultrasounds/Referrals: Isolated EIF (echogenic intracardiac focus) Fetal Ultrasounds or other Referrals:  None Maternal Substance Abuse:  No Significant Maternal Medications:  None Significant Maternal Lab Results:  Group B Strep negative Other Comments:  Covid+ 12/28  Review of Systems History Dilation: 2 Effacement (%): 70,80 Station: -1 Exam by:: Derinda Late, RN Blood pressure (!) 135/95, pulse 77, temperature 98.2 F (36.8 C), temperature source Oral, resp. rate 19,  height 5\' 5"  (1.651 m), weight 79.4 kg, last menstrual period 07/01/2019, SpO2 99 %, unknown if currently breastfeeding. Exam Physical Exam Constitutional:      Appearance: Normal appearance.  HENT:     Head: Normocephalic.     Mouth/Throat:     Mouth: Mucous membranes are moist.  Cardiovascular:     Rate and Rhythm: Normal rate.  Pulmonary:     Effort: Pulmonary effort is normal.  Abdominal:     Comments: EFM 130s, +accels, occ variables Ctx irreg  Musculoskeletal:        General: Normal range of motion.     Cervical back: Normal range of motion.  Skin:    General: Skin is warm and dry.  Neurological:     Mental Status: She is alert.  Psychiatric:        Mood and Affect: Mood normal.        Behavior: Behavior normal.        Thought Content: Thought content normal.     Prenatal labs: ABO, Rh: --/--/O POS (01/01 2250) Antibody: NEG (01/01 2250) Rubella: 6.61 (06/11 1102) RPR: Non Reactive (10/11 1048)  HBsAg: Negative (06/11 1102)  HIV: Non Reactive (10/11 1048)  GBS: Negative/-- (12/13 0114)   Assessment/Plan: IUP@39 .3wks Covid+  Elevated BP in MAU without gHTN dx Unfavorable cx  Admit to Labor and Delivery Pre-e labs pending; continue to watch BPs and make gHTN dx if meets criteria Contact precautions Plan cx ripening with cytotec followed by Pitocin/AROM prn Anticipate vag del   Arabella Merles CNM 04/02/2020, 12:58 AM

## 2020-04-02 NOTE — Progress Notes (Addendum)
Pt refused cervical exam

## 2020-04-02 NOTE — Lactation Note (Signed)
This note was copied from a baby's chart. Lactation Consultation Note  Patient Name: Ann Boyd GHWEX'H Date: 04/02/2020 Reason for consult: L&D Initial assessment Age:22 hours  Per LD RN baby breast fed 20 mins .  Baby sucking on her fingers and LC offered  To assist to switch to the left breast. Baby latched easily with few swallows.  Per mom comfortable. Baby still latched on the left breast and mom talking on phone.  Mom aware LC will see mom again today.   Maternal Data    Feeding Feeding Type: Breast Fed  LATCH Score Latch: Grasps breast easily, tongue down, lips flanged, rhythmical sucking.  Audible Swallowing: A few with stimulation  Type of Nipple: Everted at rest and after stimulation  Comfort (Breast/Nipple): Soft / non-tender  Hold (Positioning): Assistance needed to correctly position infant at breast and maintain latch.  LATCH Score: 8  Interventions Interventions: Breast feeding basics reviewed;Assisted with latch;Skin to skin  Lactation Tools Discussed/Used     Consult Status Consult Status: Follow-up Date: 04/02/20 Follow-up type: In-patient    Matilde Sprang Sephiroth Mcl[place]ie 04/02/2020, 10:54 AM

## 2020-04-02 NOTE — Progress Notes (Signed)
Patient ID: Renesmay Nesbitt, female   DOB: Jun 05, 2000, 20 y.o.   MRN: [id number]  Sitting upright in bed; denies pressure/urge to push; s/p cytotec x 1 dose at [place], now has been completely dilated since 0700  BP 130/93, 123/74 FHR 120s, +accels, no decels Ctx q 2 mins Cx C/C/vtx 0  IUP@39 .3wks New onset gHTN on admit End 1st stage  Will allow to labor down and push either with urge, or within next hour Anticipate vag del  Arabella Merles [facility] 04/02/2020

## 2020-04-02 NOTE — Anesthesia Preprocedure Evaluation (Signed)
Anesthesia Evaluation  Patient identified by MRN, date of birth, ID band Patient awake    Reviewed: Allergy & Precautions, Patient's Chart, lab work & pertinent test results  Airway Mallampati: II  TM Distance: >3 FB Neck ROM: Full    Dental no notable dental hx.    Pulmonary pneumonia, unresolved,  COVID + 12/26 (chills, dry cough at the time; currently just loss of taste/smell)   Pulmonary exam normal breath sounds clear to auscultation       Cardiovascular negative cardio ROS Normal cardiovascular exam Rhythm:Regular Rate:Normal     Neuro/Psych negative neurological ROS  negative psych ROS   GI/Hepatic negative GI ROS, Neg liver ROS,   Endo/Other  negative endocrine ROS  Renal/GU negative Renal ROS  negative genitourinary   Musculoskeletal negative musculoskeletal ROS (+)   Abdominal   Peds negative pediatric ROS (+)  Hematology negative hematology ROS (+)   Anesthesia Other Findings   Reproductive/Obstetrics (+) Pregnancy G1P0 IOL for gest HTN and dec FM since tested + for COVID 2d ago                             Anesthesia Physical Anesthesia Plan  ASA: III and emergent  Anesthesia Plan: Epidural   Post-op Pain Management:    Induction:   PONV Risk Score and Plan: 2  Airway Management Planned: Natural Airway  Additional Equipment: None  Intra-op Plan:   Post-operative Plan:   Informed Consent: I have reviewed the patients History and Physical, chart, labs and discussed the procedure including the risks, benefits and alternatives for the proposed anesthesia with the patient or authorized representative who has indicated his/her understanding and acceptance.       Plan Discussed with:   Anesthesia Plan Comments:         Anesthesia Quick Evaluation

## 2020-04-02 NOTE — Progress Notes (Signed)
Spoke to MAU nurse who will send a vaginal swab/GC Chlamydia kit up to [facility] and will include written instructions on how RN will obtain the swab.

## 2020-04-03 LAB — T.PALLIDUM AB, TOTAL: T Pallidum Abs: NONREACTIVE

## 2020-04-03 LAB — CBC WITH DIFFERENTIAL/PLATELET
Abs Immature Granulocytes: 0.06 10*3/uL (ref 0.00–0.07)
Basophils Absolute: 0 10*3/uL (ref 0.0–0.1)
Basophils Relative: 0 %
Eosinophils Absolute: 0 10*3/uL (ref 0.0–0.5)
Eosinophils Relative: 0 %
HCT: 27.6 % — ABNORMAL LOW (ref 36.0–46.0)
Hemoglobin: 8.9 g/dL — ABNORMAL LOW (ref 12.0–15.0)
Immature Granulocytes: 1 %
Lymphocytes Relative: 18 %
Lymphs Abs: 2.3 10*3/uL (ref 0.7–4.0)
MCH: 27.1 pg (ref 26.0–34.0)
MCHC: 32.2 g/dL (ref 30.0–36.0)
MCV: 84.1 fL (ref 80.0–100.0)
Monocytes Absolute: 1.4 10*3/uL — ABNORMAL HIGH (ref 0.1–1.0)
Monocytes Relative: 11 %
Neutro Abs: 9 10*3/uL — ABNORMAL HIGH (ref 1.7–7.7)
Neutrophils Relative %: 70 %
Platelets: 153 10*3/uL (ref 150–400)
RBC: 3.28 MIL/uL — ABNORMAL LOW (ref 3.87–5.11)
RDW: 13.6 % (ref 11.5–15.5)
WBC: 12.9 10*3/uL — ABNORMAL HIGH (ref 4.0–10.5)
nRBC: 0 % (ref 0.0–0.2)

## 2020-04-03 LAB — GC/CHLAMYDIA PROBE AMP (~~LOC~~) NOT AT ARMC
Chlamydia: NEGATIVE
Comment: NEGATIVE
Comment: NORMAL
Neisseria Gonorrhea: NEGATIVE

## 2020-04-03 MED ORDER — NIFEDIPINE ER OSMOTIC RELEASE 30 MG PO TB24
30.0000 mg | ORAL_TABLET | Freq: Every day | ORAL | Status: DC
  Administered 2020-04-03 – 2020-04-04 (×2): 30 mg via ORAL
  Filled 2020-04-03 (×2): qty 1

## 2020-04-03 NOTE — Lactation Note (Signed)
This note was copied from a baby's chart. Lactation Consultation Note  Patient Name: Ann Boyd Date: 04/03/2020   Age:22 hours Mom trying to eat her dinner.  Infant recently fed.  Mom inquired about formula.  Urged mom to speak with her RN regarding this. Reports she had planned to do both.  Educated on exclusive breastfeeding and staying safe for mom and baby with Covid.  Urged mom to consider pumping and offering her milk in bottles instead of formula.  Mom denies need for lactation assistance at this time. Urged to call lactation as needed. Maternal Data    Feeding Feeding Type: Breast Fed  [facility] Score                   Interventions    Lactation Tools Discussed/Used     Consult Status      Ann Boyd 04/03/2020, 7:12 PM

## 2020-04-03 NOTE — Anesthesia Postprocedure Evaluation (Signed)
Anesthesia Post Note  Patient: Ann Boyd  Procedure(s) Performed: AN AD HOC LABOR EPIDURAL     Patient location during evaluation: Mother Baby Anesthesia Type: Epidural Level of consciousness: awake and alert Pain management: pain level controlled Vital Signs Assessment: post-procedure vital signs reviewed and stable Respiratory status: spontaneous breathing, nonlabored ventilation and respiratory function stable Cardiovascular status: stable Postop Assessment: no headache, no backache and epidural receding Anesthetic complications: no   No complications documented.  Last Vitals:  Vitals:   04/02/20 2143 04/03/20 0612  BP: 126/87 (!) 135/95  Pulse: 72 95  Resp: 18 18  Temp: 36.8 C 37.1 C  SpO2: 100% 100%    Last Pain:  Vitals:   04/03/20 0612  TempSrc: Oral  PainSc: 0-No pain   Pain Goal: Patients Stated Pain Goal: 3 (04/02/20 2143)                 Junious Silk

## 2020-04-03 NOTE — Lactation Note (Signed)
This note was copied from a baby's chart. Lactation Consultation Note Baby 14 hrs old. Secretary asked mom if it was time to feed the baby and would she like Lactation to come and see her. Mom stated she was OK for right now.  Encouraged to call when needed.  Patient Name: Ann Boyd CVELF'Y Date: 04/03/2020   Age:22 hours  Maternal Data    Feeding Feeding Type: Breast Fed  LATCH Score                   Interventions    Lactation Tools Discussed/Used     Consult Status      Charyl Dancer 04/03/2020, 12:45 AM

## 2020-04-03 NOTE — Progress Notes (Addendum)
POSTPARTUM PROGRESS NOTE  Subjective: Ann Boyd is a 20 y.o. G2P1011 s/p vaginal delivery at [redacted]w[redacted]d.  She reports she doing well. No acute events overnight. She denies any problems with ambulating, voiding or po intake. Denies nausea or vomiting. She has passed flatus. Pain is moderately controlled.  Lochia is minimal.  Objective: Blood pressure 126/87, pulse 72, temperature 98.3 F (36.8 C), temperature source Oral, resp. rate 18, height 5\' 5"  (1.651 m), weight 79.4 kg, last menstrual period 07/01/2019, SpO2 100 %, unknown if currently breastfeeding.  Physical Exam:  General: alert, cooperative and no distress Chest: no respiratory distress Abdomen: soft, non-tender  Uterine Fundus: firm and at level of umbilicus Extremities: No calf swelling or tenderness  no LE edema  Recent Labs    04/01/20 2250  HGB 11.3*  HCT 35.7*    Assessment/Plan: Ann Boyd is a 20 y.o. G2P1011 s/p vaginal delivery at [redacted]w[redacted]d.  Routine Postpartum Care: Doing well, pain well-controlled.  -- Continue routine care, lactation support  -- Contraception: POPs -- Feeding: breast -- Anemia: QBL 981 s/p delivery. Hgb 8.9 today. Will continue po iron. -- Postpartum Fever: Pt with temp to 100.97F at 1330 on 04/02/20. In addition, notable purulent discharge noted at time of delivery. GC/C swab collected but pending. Pt started on clindamycin+gentamycin for empiric treatment of endometritis. Will plan to continue antibiotics until clinical improvement and afebrile x24hr. -- Reactive RPR: Confirmatory T. pallidum pending. -- gHTN: blood pressure in mild range s/p delivery. Asymptomatic. Will start procardia 30mg  daily today.  Dispo: Plan for discharge PPD#2.  05/31/20, MD OB Fellow, Faculty [facility] 04/03/2020 3:35 AM

## 2020-04-03 NOTE — Progress Notes (Signed)
Dr. Lynnda Shields called to inquire about GC/Chlyamidia swab. This RN swabbed pt and sent lab down around 2140. Main lab called to verify receipt, lab tech affirmed swab was received but that cytology who is responsible for running sample is not in-house until 0700 tomorrow.    Elvia Collum, RN 04/03/20

## 2020-04-04 ENCOUNTER — Other Ambulatory Visit (HOSPITAL_COMMUNITY): Payer: Self-pay | Admitting: Obstetrics and Gynecology

## 2020-04-04 DIAGNOSIS — O98513 Other viral diseases complicating pregnancy, third trimester: Secondary | ICD-10-CM

## 2020-04-04 DIAGNOSIS — U071 COVID-19: Secondary | ICD-10-CM

## 2020-04-04 MED ORDER — ACETAMINOPHEN 325 MG PO TABS: 650.0000 mg | ORAL_TABLET | ORAL | Status: DC | PRN

## 2020-04-04 MED ORDER — NIFEDIPINE ER OSMOTIC RELEASE 30 MG PO TB24: 30.0000 mg | ORAL_TABLET | Freq: Every day | ORAL | 0 refills | Status: DC

## 2020-04-04 MED ORDER — IBUPROFEN 600 MG PO TABS: 600.0000 mg | ORAL_TABLET | Freq: Four times a day (QID) | ORAL | 0 refills | Status: DC

## 2020-04-04 MED FILL — NIFEdipine ER 30 MG TB24: 30 | 30 days supply | Qty: 30 | Fill #0

## 2020-04-04 NOTE — Lactation Note (Signed)
This note was copied from a baby's chart. Lactation Consultation Note  Patient Name: Ann Boyd IPJAS'N Date: 04/04/2020 Reason for consult: Follow-up assessment Age:22 hours   Per LC Note from yesterday:  Mother declines lactation assistance and will call as needed for questions/concerns.     Maternal Data    Feeding Feeding Type: Breast Fed  LATCH Score                   Interventions    Lactation Tools Discussed/Used     Consult Status Consult Status: Complete Date: 04/04/20 Follow-up type: Call as needed    Irene Pap Sybrina Laning 04/04/2020, 9:36 AM

## 2020-04-05 LAB — SURGICAL PATHOLOGY

## 2020-04-06 ENCOUNTER — Ambulatory Visit (INDEPENDENT_AMBULATORY_CARE_PROVIDER_SITE_OTHER): Payer: Medicaid Other

## 2020-04-06 VITALS — BP 118/85 | HR 116

## 2020-04-06 DIAGNOSIS — Z0130 Encounter for examination of blood pressure without abnormal findings: Secondary | ICD-10-CM

## 2020-04-06 NOTE — Progress Notes (Signed)
Subjective:  Ann Boyd is a 20 y.o. female here for BP check.   Hypertension ROS: taking medications as instructed, no medication side effects noted, no TIA's, no chest pain on exertion, no dyspnea on exertion and no swelling of ankles.    Objective:  LMP 07/01/2019 (Exact Date)   Connected with patient vis telephone. General exam BP noted to be well controlled today with home cuff.   Assessment:   Blood Pressure well controlled.   Plan:  Current treatment plan is effective, no change in therapy.. Patient advised to continue monitoring bp at home. If bp start to increase again, along with unrelieved headache, swelling, and visual changes to contact the office or be seen at MAU. Patient verbalized understanding.

## 2020-04-06 NOTE — Progress Notes (Signed)
Patient was assessed and managed by nursing staff during this encounter. I have reviewed the chart and agree with the documentation and plan. I have also made any necessary editorial changes.  Catalina Antigua, MD 04/06/2020 9:45 AM

## 2020-05-01 ENCOUNTER — Other Ambulatory Visit: Payer: Self-pay

## 2020-05-01 ENCOUNTER — Ambulatory Visit (INDEPENDENT_AMBULATORY_CARE_PROVIDER_SITE_OTHER): Payer: No Typology Code available for payment source | Admitting: Advanced Practice Midwife

## 2020-05-01 ENCOUNTER — Encounter: Payer: Self-pay | Admitting: Advanced Practice Midwife

## 2020-05-01 DIAGNOSIS — O165 Unspecified maternal hypertension, complicating the puerperium: Secondary | ICD-10-CM

## 2020-05-01 DIAGNOSIS — Z8759 Personal history of other complications of pregnancy, childbirth and the puerperium: Secondary | ICD-10-CM

## 2020-05-01 DIAGNOSIS — Z8616 Personal history of COVID-19: Secondary | ICD-10-CM | POA: Insufficient documentation

## 2020-05-01 NOTE — Progress Notes (Signed)
Patient is not taking blood pressure medications daily.

## 2020-05-01 NOTE — Progress Notes (Signed)
Subjective:     Ann Boyd is a 20 y.o. female who presents for a postpartum visit. She is 4 weeks postpartum following a spontaneous vaginal delivery. I have fully reviewed the prenatal and intrapartum course. The delivery was at 39 gestational weeks. Outcome: spontaneous vaginal delivery. Anesthesia: epidural. Postpartum course has been normal. Baby's course has been normal. Baby is feeding by both breast and bottle . Bowel function is normal. Bladder function is normal. Patient is not sexually active. Contraception method is Depo-Provera injections. Postpartum depression screening: negative.  The following portions of the patient's history were reviewed and updated as appropriate: allergies, current medications, past family history, past medical history, past social history, past surgical history and problem list.  Review of Systems Pertinent items noted in HPI and remainder of comprehensive ROS otherwise negative.   Objective:    BP (!) 134/91   Pulse 73   Wt 156 lb 6.4 oz (70.9 kg)   LMP 07/01/2019 (Exact Date)   BMI 26.03 kg/m   VS reviewed, nursing note reviewed,  Constitutional: well developed, well nourished, no distress HEENT: normocephalic CV: normal rate Pulm/chest wall: normal effort Abdomen: soft Neuro: alert and oriented x 3 Skin: warm, dry Psych: affect normal  Assessment:   1. Postpartum care following vaginal delivery --Doing well, bonding well with infant, good support at home --No pain or other physical concerns --Pt is getting enough sleep and reports she is adjusting well to new baby at home  2. Postpartum hypertension --Pt was induced and delivered for GHTN at term and discharged on nifedipine 30 XL daily.  She reports she takes the medication at least every other day but it causes dizziness and headaches so she does not take it daily.  She does take her BP at home daily and it is never above low 140s/90s.  Her mother has HTN and she has always watched  hers closely. --Plan for f/u in our office in 2 weeks for BP check then with PCP afterwards  Plan:    1. Contraception: Depo-Provera injections 2. Follow up in: 2 months for Depo injection then in 1  year for annual exam

## 2020-05-01 NOTE — Patient Instructions (Signed)
Postpartum Hypertension Postpartum hypertension is high blood pressure that is higher than normal after childbirth. It usually starts within 1 to 2 days after delivery, but it can happen at any time for up to 6 weeks after delivery. For some women, medical treatment is required to prevent serious complications, such as seizures or stroke. What are the causes? The cause of this condition is not well understood. In some cases, the cause may not be known. Certain conditions may increase your risk. These include:  Hypertension that existed before pregnancy (chronic hypertension).  Hypertension that comes as a result of pregnancy (gestational hypertension).  Hypertensive disorders during pregnancy or seizures in women who have high blood pressure during pregnancy. These conditions are called preeclampsia and eclampsia.  A condition in which the liver, platelets, and red blood cells are damaged during pregnancy (HELLP syndrome).  Obesity.  Diabetes. What are the signs or symptoms? As with all types of hypertension, postpartum hypertension may not have any symptoms. Depending on how high your blood pressure is, you may experience:  Headaches. These may be mild, moderate, or severe. They may also be steady, constant, or sudden in onset (thunderclap headache).  Vision changes, such as blurry vision, flashing lights, or seeing spots.  Nausea and vomiting.  Pain in the upper right side of your abdomen.  Shortness of breath.  Difficulty breathing while lying down.  A decrease in the amount of urine that you pass. How is this diagnosed? This condition may be diagnosed based on the results of a physical exam, blood pressure measurements, and blood and urine tests. You may also have other tests, such as a CT scan or an MRI, to check for other problems of postpartum hypertension. How is this treated? If blood pressure is high enough to require treatment, your options may include:  Medicines to  reduce blood pressure (antihypertensives). Tell your health care provider if you are breastfeeding or if you plan to breastfeed. There are many antihypertensive medicines that are safe to take while breastfeeding.  Treating medical conditions that are causing hypertension.  Treating the complications of hypertension, such as seizures, stroke, or kidney problems. Your health care provider will also continue to monitor your blood pressure closely until it is within a safe range for you. Follow these instructions at home: Learn your goal blood pressure Two numbers make up your blood pressure. The first number is called systolic pressure. The second is called diastolic pressure. An example of a blood pressure reading is "120 over 80" (or 120/80). For most people, goal blood pressure is:  First number: below 140.  Second number: below 90. Your blood pressure is above normal even if only the top or bottom number is above normal. Know what to do before you take your blood pressure 30 minutes before you check your blood pressure:  Do not drink caffeine.  Do not drink alcohol.  Avoid food and drink.  Do not smoke.  Do not exercise. 5 minutes before you check your blood pressure:  Use the bathroom and urinate so that you have an empty bladder.  Sit quietly in a dining room chair. Do not sit in a soft couch or an armchair. Do not talk. Know how to take your blood pressure To check your blood pressure, follow the instructions in the manual that came with your blood pressure monitor. If you have a digital blood pressure monitor, the instructions may be as follows: 1. Sit up straight. 2. Place your feet on the floor. Do   not cross your ankles or legs. 3. Rest your left arm at the level of your heart. You may rest it on a table, desk, or chair. 4. Pull up your shirt sleeve. 5. Wrap the blood pressure cuff around the upper part of your left arm. The cuff should be 1 inch (2.5 cm) above your  elbow. It is best to wrap the cuff around bare skin. 6. Fit the cuff snugly around your arm. You should be able to place only one finger between the cuff and your arm. 7. Put the cord inside the groove of your elbow. 8. Press the power button. 9. Sit quietly while the cuff fills with air and loses air. 10. Write down the numbers on the screen. These are your blood pressure readings. 11. Wait 1-2 minutes and then repeat steps 1-10.   Record your blood pressure readings Follow your health care provider's instructions on how to record your blood pressure readings. If you were asked to use this form, follow these instructions:  Get one reading in the morning (a.m.) before you take any medicines.  Get one reading in the evening (p.m.) before supper.  Take at least 2 readings with each blood pressure check. This makes sure the results are correct. Wait 1-2 minutes between measurements.  Write down the results in the spaces on this form. Date: _______________________  a.m. _____________________(1st reading) _____________________(2nd reading)  p.m. _____________________(1st reading) _____________________(2nd reading) Date: _______________________  a.m. _____________________(1st reading) _____________________(2nd reading)  p.m. _____________________(1st reading) _____________________(2nd reading) Date: _______________________  a.m. _____________________(1st reading) _____________________(2nd reading)  p.m. _____________________(1st reading) _____________________(2nd reading) Date: _______________________  a.m. _____________________(1st reading) _____________________(2nd reading)  p.m. _____________________(1st reading) _____________________(2nd reading) Date: _______________________  a.m. _____________________(1st reading) _____________________(2nd reading)  p.m. _____________________(1st reading) _____________________(2nd reading) General instructions  Take over-the-counter and  prescription medicines only as told by your health care provider.  Do not use any products that contain nicotine or tobacco. These products include cigarettes, chewing tobacco, and vaping devices, such as e-cigarettes. If you need help quitting, ask your health care provider.  Check your blood pressure as often as recommended by your health care provider.  Return to your normal activities as told by your health care provider. Ask your health care provider what activities are safe for you.  Keep all follow-up visits. This is important. Contact a health care provider if:  You have new symptoms, such as: ? A headache that does not get better. ? Dizziness. ? Visual changes. ? Nausea and vomiting. Get help right away if:  You develop difficulty breathing.  You have chest pain.  You faint.  You have any symptoms of a stroke. "BE FAST" is an easy way to remember the main warning signs of a stroke: ? B - Balance. Signs are dizziness, sudden trouble walking, or loss of balance. ? E - Eyes. Signs are trouble seeing or a sudden change in vision. ? F - Face. Signs are sudden weakness or numbness of the face, or the face or eyelid drooping on one side. ? A - Arms. Signs are weakness or numbness in an arm. This happens suddenly and usually on one side of the body. ? S - Speech. Signs are sudden trouble speaking, slurred speech, or trouble understanding what people say. ? T - Time. Time to call emergency services. Write down what time symptoms started.  You have other signs of a stroke, such as: ? A sudden, severe headache with no known cause. ? Nausea or vomiting. ? Seizure. These symptoms   may represent a serious problem that is an emergency. Do not wait to see if the symptoms will go away. Get medical help right away. Call your local emergency services (911 in the [place] Do not drive yourself to the hospital. Summary  Postpartum hypertension is high blood pressure that remains higher than  normal after childbirth.  For some women, medical treatment is required to prevent serious complications, such as seizures or stroke.  Follow your health care provider's instructions on how to record your blood pressure readings.  Keep all follow-up visits. This is important. This information is not intended to replace advice given to you by your health care provider. Make sure you discuss any questions you have with your health care provider. Document Revised: 12/14/2019 Document Reviewed: 12/14/2019 Elsevier Patient Education  2021 Elsevier Inc.  

## 2020-05-09 ENCOUNTER — Ambulatory Visit (HOSPITAL_COMMUNITY)
Admission: EM | Admit: 2020-05-09 | Discharge: 2020-05-09 | Disposition: A | Discharge status: Home / Self Care | Payer: No Typology Code available for payment source | Attending: Family Medicine | Admitting: Family Medicine

## 2020-05-09 ENCOUNTER — Other Ambulatory Visit: Payer: Self-pay

## 2020-05-09 ENCOUNTER — Encounter (HOSPITAL_COMMUNITY): Payer: Self-pay | Admitting: Emergency Medicine

## 2020-05-09 DIAGNOSIS — H66001 Acute suppurative otitis media without spontaneous rupture of ear drum, right ear: Secondary | ICD-10-CM | POA: Diagnosis not present

## 2020-05-09 DIAGNOSIS — H9201 Otalgia, right ear: Secondary | ICD-10-CM

## 2020-05-09 MED ORDER — AMOXICILLIN 875 MG PO TABS: 875.0000 mg | ORAL_TABLET | Freq: Two times a day (BID) | ORAL | 0 refills | Status: AC

## 2020-05-09 NOTE — ED Triage Notes (Signed)
Complains of right ear pain that started yesterday morning.

## 2020-05-09 NOTE — ED Provider Notes (Signed)
[facility]   [id number] 05/09/20 Arrival Time: 1607  ASSESSMENT & PLAN:  1. Non-recurrent acute suppurative otitis media of right ear without spontaneous rupture of tympanic membrane   2. Right ear pain     Begin: Meds ordered this encounter  Medications  . amoxicillin (AMOXIL) 875 MG tablet    Sig: Take 1 tablet (875 mg total) by mouth 2 (two) times daily for 10 days.    Dispense:  20 tablet    Refill:  0    OTC symptom care as needed.   Follow-up Information    Simmons-Robinson, Tawanna Cooler, MD.   Specialty: Family Medicine Why: As needed. Contact information: [address] [place] [address] [phone]        [facility].   Specialty: Urgent Care Why: If worsening or failing to improve as anticipated. Contact information: [address] [place] [address] [phone]               Reviewed expectations re: course of current medical issues. Questions answered. Outlined signs and symptoms indicating need for more acute intervention. Patient verbalized understanding. After Visit Summary given.   SUBJECTIVE: History from: patient.  Ann Boyd is a 20 y.o. female who presents with complaint of right otalgia; without drainage; without bleeding. Onset abrupt, yesterday. Recent cold symptoms: none. Fever: none. Overall normal PO intake without n/v. Sick contacts: no. OTC treatment: none. Does not use Q-tips in ears.  Social History   Tobacco Use  Smoking Status Never Smoker  Smokeless Tobacco Never Used    OBJECTIVE:  Vitals:   05/09/20 1726  BP: 127/77  Pulse: 75  Resp: 19  Temp: 99.4 F (37.4 C)  TempSrc: Oral  SpO2: 99%     General appearance: alert Ear Canal: mild edema and inflammation on the right TM: right: erythematous, dull, bulging Neck: supple without LAD Lungs: unlabored respirations, symmetrical air entry; cough: absent; no respiratory distress Skin: warm and  dry Psychological: alert and cooperative; normal mood and affect  No Known Allergies  Past Medical History:  Diagnosis Date  . Hx of chlamydia infection 09/2018  . Hx of gonorrhea 09/2018  . Hx of trichomoniasis 09/2017  . Medical history non-contributory    Family History  Problem Relation Age of Onset  . Healthy Mother   . Hypertension Mother   . Healthy Father   . ADD / ADHD Brother    Social History   Socioeconomic History  . Marital status: Single    Spouse name: Not on file  . Number of children: Not on file  . Years of education: Not on file  . Highest education level: Not on file  Occupational History  . Occupation: [employer]  Tobacco Use  . Smoking status: Never Smoker  . Smokeless tobacco: Never Used  Vaping Use  . Vaping Use: Never used  Substance and Sexual Activity  . Alcohol use: No  . Drug use: No  . Sexual activity: Not Currently    Birth control/protection: Injection  Other Topics Concern  . Not on file  Social History Narrative  . Not on file   Social Determinants of Health   Financial Resource Strain: Not on file  Food Insecurity: Not on file  Transportation Needs: Not on file  Physical Activity: Not on file  Stress: Not on file  Social Connections: Not on file  Intimate Partner Violence: Not on file  Mardella Layman, MD 05/09/20 1750

## 2020-05-15 ENCOUNTER — Other Ambulatory Visit: Payer: Self-pay

## 2020-05-15 ENCOUNTER — Ambulatory Visit (INDEPENDENT_AMBULATORY_CARE_PROVIDER_SITE_OTHER): Payer: No Typology Code available for payment source

## 2020-05-15 VITALS — BP 131/81 | HR 84 | Ht 65.0 in | Wt 159.1 lb

## 2020-05-15 DIAGNOSIS — O135 Gestational [pregnancy-induced] hypertension without significant proteinuria, complicating the puerperium: Secondary | ICD-10-CM

## 2020-05-15 DIAGNOSIS — O165 Unspecified maternal hypertension, complicating the puerperium: Secondary | ICD-10-CM

## 2020-05-15 NOTE — Progress Notes (Signed)
Subjective:  Ann Boyd is a 20 y.o. female here for BP check.   Hypertension ROS: Pt is currently taking medication every other day as discussed with Sharen Counter, CNM. She denies any TIAs, chest pain on exertion, dyspnea on exertion, or swelling in her ankles.    Objective:  BP: 131/81 today. Appearance alert, well appearing, and in no distress. General exam BP noted to be well controlled today in office.    Assessment:   Blood Pressure well controlled.   Plan:  Current treatment plan is effective, no change in therapy.Pt instructed to return in one week for BP check. Pt was advised to d/c medication the day before her BP check and day of appointment.

## 2020-05-22 ENCOUNTER — Other Ambulatory Visit: Payer: Self-pay

## 2020-05-22 ENCOUNTER — Ambulatory Visit: Payer: No Typology Code available for payment source

## 2020-05-22 VITALS — BP 136/85 | HR 68

## 2020-05-22 DIAGNOSIS — O165 Unspecified maternal hypertension, complicating the puerperium: Secondary | ICD-10-CM

## 2020-05-22 NOTE — Progress Notes (Addendum)
Subjective:  Ann Boyd is a 20 y.o. female here for BP check.   Hypertension ROS: taking medications as instructed, no medication side effects noted, no TIA's, no chest pain on exertion, no dyspnea on exertion, no swelling of ankles, no orthostatic dizziness or lightheadedness and no palpitations.    Objective:  LMP  (LMP Unknown) Comment: Depo  Appearance alert, well appearing, and in no distress. General exam BP noted to be well controlled today in office.    Assessment:   Blood Pressure stable. Discussed w/provider.  Plan:  Keep scheuled Depo appt..  Patient was assessed and managed by nursing staff during this encounter. I have reviewed the chart and agree with the documentation and plan. I have also made any necessary editorial changes.  Coral Ceo, MD 05/22/2020 2:49 PM

## 2020-06-27 ENCOUNTER — Ambulatory Visit (INDEPENDENT_AMBULATORY_CARE_PROVIDER_SITE_OTHER)
Payer: No Typology Code available for payment source | Admitting: Student in an Organized Health Care Education/Training Program

## 2020-06-27 ENCOUNTER — Encounter: Payer: Self-pay | Admitting: Student in an Organized Health Care Education/Training Program

## 2020-06-27 ENCOUNTER — Other Ambulatory Visit: Payer: Self-pay

## 2020-06-27 DIAGNOSIS — Z3042 Encounter for surveillance of injectable contraceptive: Secondary | ICD-10-CM | POA: Diagnosis not present

## 2020-06-27 DIAGNOSIS — Z3040 Encounter for surveillance of contraceptives, unspecified: Secondary | ICD-10-CM | POA: Insufficient documentation

## 2020-06-27 MED ORDER — MEDROXYPROGESTERONE ACETATE 150 MG/ML IM SUSY
150.0000 mg | PREFILLED_SYRINGE | Freq: Once | INTRAMUSCULAR | Status: AC
  Administered 2020-06-27: 150 mg via INTRAMUSCULAR

## 2020-06-27 NOTE — Progress Notes (Signed)
   SUBJECTIVE:   CHIEF COMPLAINT / HPI: depo shot  Contraception encounter-patient denies questions or concerns today.  She is postpartum by almost 3 months and had her last Depo shot shortly after delivery. still within the 5-month window for next dose.  She stopped breast-feeding approximately 3 weeks ago and has had no return of menstrual cycle yet. She requests next dose of Depo today and has had this form of birth control prior to her pregnancy.  OBJECTIVE:   BP 115/80   Pulse 80   Ht 5\' 5"  (1.651 m)   Wt 164 lb 6.4 oz (74.6 kg)   SpO2 99%   BMI 27.36 kg/m   Physical Exam Vitals and nursing note reviewed.  Constitutional:      General: She is not in acute distress.    Appearance: Normal appearance. She is normal weight.  Pulmonary:     Effort: Pulmonary effort is normal.  Neurological:     Mental Status: She is alert and oriented to person, place, and time.  Psychiatric:        Mood and Affect: Mood normal.        Behavior: Behavior normal.    ASSESSMENT/PLAN:   Contraceptive surveillance Patient within window for next dose of depo not requiring a pregnancy test Has not had return of periods since delivery 04/2020 and stopped breast feeding approximately 3 weeks ago. Depo administered today.     05/2020, DO [facility]

## 2020-06-27 NOTE — Assessment & Plan Note (Signed)
Patient within window for next dose of depo not requiring a pregnancy test Has not had return of periods since delivery 04/2020 and stopped breast feeding approximately 3 weeks ago. Depo administered today.

## 2020-06-27 NOTE — Progress Notes (Signed)
Patient presents for depo shot. Patient within dates.  Patient given injection in LUOQ.  Patient tolerated well.  Patient given reminder card of March 22-July 04, 2020.  Patient verbalized understanding.  Glennie Hawk, CMA

## 2020-08-19 ENCOUNTER — Ambulatory Visit (HOSPITAL_COMMUNITY)
Admission: RE | Admit: 2020-08-19 | Discharge: 2020-08-19 | Disposition: A | Discharge status: Home / Self Care | Payer: No Typology Code available for payment source | Source: Ambulatory Visit | Attending: Physician Assistant | Admitting: Physician Assistant

## 2020-08-19 ENCOUNTER — Encounter (HOSPITAL_COMMUNITY): Payer: Self-pay

## 2020-08-19 ENCOUNTER — Other Ambulatory Visit: Payer: Self-pay

## 2020-08-19 VITALS — BP 125/82 | HR 80 | Temp 98.8°F | Resp 18

## 2020-08-19 DIAGNOSIS — N898 Other specified noninflammatory disorders of vagina: Secondary | ICD-10-CM

## 2020-08-19 DIAGNOSIS — Z113 Encounter for screening for infections with a predominantly sexual mode of transmission: Secondary | ICD-10-CM

## 2020-08-19 NOTE — ED Provider Notes (Signed)
[facility]    CSN: [id number] Arrival date & time: 08/19/20  1254      History   Chief Complaint Chief Complaint  Patient presents with  . Annual Exam  . Vaginal Bump  . Appointment    HPI Ann Boyd is a 20 y.o. female.   Patient presents today with a 1 day history of genital lesion.  Reports that after she shaved she noticed a bump that was nonpainful.  It has been present since that time she wanted it checked out.  She denies history of genital herpes.  She does have a history of STI but states she is been treated and had negative test of cure with this in the past.  She is unable to view lesion but believes that it has not changed since symptom onset.  She denies any additional symptoms including fever, pelvic pain, abdominal pain, nausea, vomiting.  She does report ongoing vaginal discharge but states this is her typical physiologic discharge.  She is open to STI testing.  Denies any changes to personal hygiene products including soaps or detergents or shaving products.  She denies any urinary symptoms.  Denies any concern for pregnancy.     Past Medical History:  Diagnosis Date  . Hx of chlamydia infection 09/2018  . Hx of gonorrhea 09/2018  . Hx of trichomoniasis 09/2017  . Medical history non-contributory     Patient Active Problem List   Diagnosis Date Noted  . Contraceptive surveillance 06/27/2020  . History of COVID-19 05/01/2020  . History of gestational hypertension 05/01/2020  . COVID-19 affecting pregnancy in third trimester 04/04/2020    Past Surgical History:  Procedure Laterality Date  . NO PAST SURGERIES      OB History    Gravida  2   Para  1   Term  1   Preterm      AB  1   Living  1     SAB      IAB      Ectopic      Multiple  0   Live Births  1            Home Medications    Prior to Admission medications   Medication Sig Start Date End Date Taking? Authorizing Provider  NIFEdipine (ADALAT CC) 30  MG 24 hr tablet TAKE 1 TABLET (30 MG TOTAL) BY MOUTH DAILY. 04/04/20 04/04/21  Gita Kudo, MD  NIFEdipine (PROCARDIA-XL/NIFEDICAL-XL) 30 MG 24 hr tablet Take 1 tablet (30 mg total) by mouth daily. Patient not taking: Reported on 05/22/2020 04/04/20   Gita Kudo, MD  Prenat-FeCbn-FeAsp-Meth-FA-DHA (PRENATE MINI) 18-0.6-0.4-350 MG CAPS Take 1 capsule by mouth daily. Patient not taking: Reported on 05/22/2020 01/10/20   Hurshel Party, CNM  ferrous gluconate (FERGON) 324 MG tablet Take 1 tablet (324 mg total) by mouth daily with breakfast. Patient not taking: Reported on 05/01/2020 09/13/19 05/09/20  Sharyon Cable, CNM  medroxyPROGESTERone (DEPO-PROVERA) 150 MG/ML injection Inject 1 mL (150 mg total) into the muscle every 3 (three) months. 11/26/12 11/30/18  Dessa Phi, MD    Family History Family History  Problem Relation Age of Onset  . Healthy Mother   . Hypertension Mother   . Healthy Father   . ADD / ADHD Brother     Social History Social History   Tobacco Use  . Smoking status: Never Smoker  . Smokeless tobacco: Never Used  Vaping Use  . Vaping Use: Never used  Substance Use Topics  . Alcohol use: No  . Drug use: No     Allergies   Patient has no known allergies.   Review of Systems Review of Systems  Constitutional: Negative for activity change, appetite change, fatigue and fever.  Respiratory: Negative for cough and shortness of breath.   Cardiovascular: Negative for chest pain.  Gastrointestinal: Negative for abdominal pain, diarrhea, nausea and vomiting.  Genitourinary: Positive for genital sores and vaginal discharge. Negative for dyspareunia, dysuria, frequency, urgency, vaginal bleeding and vaginal pain.  Neurological: Negative for dizziness, light-headedness and headaches.     Physical Exam Triage Vital Signs ED Triage Vitals  Enc Vitals Group     BP 08/19/20 1315 125/82     Pulse Rate 08/19/20 1315 80     Resp 08/19/20 1315 18      Temp 08/19/20 1315 98.8 F (37.1 C)     Temp src --      SpO2 08/19/20 1315 100 %     Weight --      Height --      Head Circumference --      Peak Flow --      Pain Score 08/19/20 1313 0     Pain Loc --      Pain Edu? --      Excl. in GC? --    No data found.  Updated Vital Signs BP 125/82   Pulse 80   Temp 98.8 F (37.1 C)   Resp 18   LMP  (LMP Unknown) Comment: Does report period last month but day not known  SpO2 100%   Visual Acuity Right Eye Distance:   Left Eye Distance:   Bilateral Distance:    Right Eye Near:   Left Eye Near:    Bilateral Near:     Physical Exam Vitals reviewed. Exam conducted with a chaperone present.  Constitutional:      General: She is awake. She is not in acute distress.    Appearance: Normal appearance. She is not ill-appearing.     Comments: Very pleasant female appears stated age in no acute distress  HENT:     Head: Normocephalic and atraumatic.  Cardiovascular:     Rate and Rhythm: Normal rate and regular rhythm.     Heart sounds: No murmur heard.   Pulmonary:     Effort: Pulmonary effort is normal.     Breath sounds: Normal breath sounds. No wheezing, rhonchi or rales.  Abdominal:     General: Bowel sounds are normal.     Palpations: Abdomen is soft.     Tenderness: There is no abdominal tenderness. There is no right CVA tenderness, left CVA tenderness, guarding or rebound.     Comments: Benign abdominal exam  Genitourinary:    Labia:        Right: Lesion present. No rash or tenderness.        Left: No rash, tenderness or lesion.      Vagina: Bleeding present.     Cervix: Erythema present. No cervical motion tenderness.     Uterus: Normal.      Adnexa: Right adnexa normal and left adnexa normal.       Comments: Selena Batten, RN present as chaperone during exam. Psychiatric:        Behavior: Behavior is cooperative.      UC Treatments / Results  Labs (all labs ordered are listed, but only abnormal results are  displayed) Labs Reviewed  HSV CULTURE AND TYPING  CERVICOVAGINAL ANCILLARY ONLY    EKG   Radiology No results found.  Procedures Procedures (including critical care time)  Medications Ordered in UC Medications - No data to display  Initial Impression / Assessment and Plan / UC Course  I have reviewed the triage vital signs and the nursing notes.  Pertinent labs & imaging results that were available during my care of the patient were reviewed by me and considered in my medical decision making (see chart for details).     Suspect lesion is from patient shaving but given it appears ulcerated will obtain HSV culture.  Discussed that if this is negative it does not rule out HSV as etiology and if she has any recurrent lesion she should be reevaluated.  Patient has significant vaginal discharge on exam but states this is typical for her.  STI swab collected-results pending.  Will defer treatment until results are obtained.  Patient was instructed to wear loosefitting cotton underwear use hypoallergenic soaps and detergents.  Strict return precautions given to which patient expressed understanding.  Final Clinical Impressions(s) / UC Diagnoses   Final diagnoses:  Vaginal lesion  Vaginal discharge  Routine screening for STI (sexually transmitted infection)     Discharge Instructions     We will be in touch with your results as soon as we have them.  If we need to arrange treatment we will contact you to schedule this or call medication into your pharmacy.  Use hypoallergenic soaps and detergents.  If you have recurrent or worsening symptoms please return for reevaluation.    ED Prescriptions    None     PDMP not reviewed this encounter.   Jeani Hawking, PA-C 08/19/20 1336

## 2020-08-19 NOTE — ED Triage Notes (Signed)
Pt states has not had checkup since she had her daughter and would like a checkup. Pt states she has a non painful bump/sore in vaginal area which she noticed Friday.

## 2020-08-19 NOTE — Discharge Instructions (Signed)
We will be in touch with your results as soon as we have them.  If we need to arrange treatment we will contact you to schedule this or call medication into your pharmacy.  Use hypoallergenic soaps and detergents.  If you have recurrent or worsening symptoms please return for reevaluation.

## 2020-08-21 LAB — CERVICOVAGINAL ANCILLARY ONLY
Bacterial Vaginitis (gardnerella): NEGATIVE
Candida Glabrata: NEGATIVE
Candida Vaginitis: POSITIVE — AB
Chlamydia: NEGATIVE
Comment: NEGATIVE
Comment: NEGATIVE
Comment: NEGATIVE
Comment: NEGATIVE
Comment: NEGATIVE
Comment: NORMAL
Neisseria Gonorrhea: NEGATIVE
Trichomonas: NEGATIVE

## 2020-08-22 ENCOUNTER — Telehealth (HOSPITAL_COMMUNITY): Payer: Self-pay | Admitting: Emergency Medicine

## 2020-08-22 LAB — HSV CULTURE AND TYPING

## 2020-08-22 MED ORDER — FLUCONAZOLE 150 MG PO TABS: 150.0000 mg | ORAL_TABLET | Freq: Once | ORAL | 0 refills | Status: AC

## 2020-09-20 ENCOUNTER — Ambulatory Visit (INDEPENDENT_AMBULATORY_CARE_PROVIDER_SITE_OTHER): Payer: No Typology Code available for payment source

## 2020-09-20 ENCOUNTER — Other Ambulatory Visit: Payer: Self-pay

## 2020-09-20 DIAGNOSIS — Z3049 Encounter for surveillance of other contraceptives: Secondary | ICD-10-CM | POA: Diagnosis not present

## 2020-09-20 MED ORDER — MEDROXYPROGESTERONE ACETATE 150 MG/ML IM SUSP
150.0000 mg | Freq: Once | INTRAMUSCULAR | Status: AC
  Administered 2020-09-20: 150 mg via INTRAMUSCULAR

## 2020-09-20 NOTE — Progress Notes (Signed)
Patient here today for Depo Provera injection and is within her dates.    Last contraceptive appt was 06/27/2020.  Depo given in RUOQ today. Site unremarkable & patient tolerated injection.    Next injection due 12/06/2020-12/20/2020. Reminder card given.

## 2020-11-13 ENCOUNTER — Ambulatory Visit (HOSPITAL_COMMUNITY): Payer: Self-pay

## 2020-11-23 ENCOUNTER — Other Ambulatory Visit: Payer: Self-pay

## 2020-11-23 ENCOUNTER — Ambulatory Visit (HOSPITAL_COMMUNITY)
Admission: EM | Admit: 2020-11-23 | Discharge: 2020-11-23 | Disposition: A | Discharge status: Home / Self Care | Payer: No Typology Code available for payment source | Attending: Internal Medicine | Admitting: Internal Medicine

## 2020-11-23 ENCOUNTER — Encounter (HOSPITAL_COMMUNITY): Payer: Self-pay

## 2020-11-23 DIAGNOSIS — Z1152 Encounter for screening for COVID-19: Secondary | ICD-10-CM | POA: Diagnosis not present

## 2020-11-23 DIAGNOSIS — J069 Acute upper respiratory infection, unspecified: Secondary | ICD-10-CM | POA: Insufficient documentation

## 2020-11-23 LAB — POCT RAPID STREP A, ED / UC: Streptococcus, Group A Screen (Direct): NEGATIVE

## 2020-11-23 LAB — SARS CORONAVIRUS 2 (TAT 6-24 HRS): SARS Coronavirus 2: NEGATIVE

## 2020-11-23 MED ORDER — BENZONATATE 100 MG PO CAPS: 100.0000 mg | ORAL_CAPSULE | Freq: Three times a day (TID) | ORAL | 0 refills | Status: DC | PRN

## 2020-11-23 NOTE — Discharge Instructions (Addendum)
Your rapid strep test today was negative. COVID testing ordered. It will take between 1-2 days for test results. Someone will contact you regarding abnormal results or you can access your results through MyChart.   In the meantime you should... Remain isolated in your home for 5 days from symptom onset AND greater than 48 hours of no fever without the use of fever-reducing medication Get plenty of rest and fluids Tessalon Perles prescribed for cough Flonase for nasal congestion and/or runny nose You can take OTC Zyrtec-D for nasal congestion, runny nose, and/or sore throat Use these medications as directed for symptom relief Use Tylenol or Ibuprofen as needed for fever or pain Return or go to the ER for any worsening or new symptoms such as high fever, worsening cough, shortness of breath, chest tightness, chest pain, changes in mental status, etc.

## 2020-11-23 NOTE — ED Provider Notes (Signed)
[facility]    CSN: [id number] Arrival date & time: 11/23/20  5852    History   Chief Complaint Chief Complaint  Patient presents with   Nasal Congestion   Headache   Generalized Body Aches    HPI Ann Boyd is a 20 y.o. female otherwise healthy presents to urgent care today with complaints of congestion and body aches.  Patient states symptoms ongoing for 3 days with upper airway congestion, mildly productive cough, body aches, rhinorrhea, sore throat.  Patient denies any chest pain but states chest feels sore with movement and palpation.  She denies any recent shortness of breath, abdominal pain, N/V/D.  Uncertain if she has had any fever but does endorse chills yesterday..  Patient took cold and sinus medication this morning just prior to visit that has helped body aches.  Reports infant daughter in daycare and has runny nose.  Patient works as Lawyer.    Past Medical History:  Diagnosis Date   Hx of chlamydia infection 09/2018   Hx of gonorrhea 09/2018   Hx of trichomoniasis 09/2017   Medical history non-contributory     Patient Active Problem List   Diagnosis Date Noted   Contraceptive surveillance 06/27/2020   History of COVID-19 05/01/2020   History of gestational hypertension 05/01/2020   COVID-19 affecting pregnancy in third trimester 04/04/2020    Past Surgical History:  Procedure Laterality Date   NO PAST SURGERIES      OB History     Gravida  2   Para  1   Term  1   Preterm      AB  1   Living  1      SAB      IAB      Ectopic      Multiple  0   Live Births  1            Home Medications    Prior to Admission medications   Medication Sig Start Date End Date Taking? Authorizing Provider  benzonatate (TESSALON PERLES) 100 MG capsule Take 1 capsule (100 mg total) by mouth 3 (three) times daily as needed for cough. 11/23/20 11/23/21 Yes Rolla Etienne, NP  NIFEdipine (ADALAT CC) 30 MG 24 hr tablet TAKE 1 TABLET (30 MG  TOTAL) BY MOUTH DAILY. 04/04/20 04/04/21  Gita Kudo, MD  NIFEdipine (PROCARDIA-XL/NIFEDICAL-XL) 30 MG 24 hr tablet Take 1 tablet (30 mg total) by mouth daily. Patient not taking: Reported on 05/22/2020 04/04/20   Gita Kudo, MD  Prenat-FeCbn-FeAsp-Meth-FA-DHA (PRENATE MINI) 18-0.6-0.4-350 MG CAPS Take 1 capsule by mouth daily. Patient not taking: Reported on 05/22/2020 01/10/20   Hurshel Party, CNM  ferrous gluconate (FERGON) 324 MG tablet Take 1 tablet (324 mg total) by mouth daily with breakfast. Patient not taking: Reported on 05/01/2020 09/13/19 05/09/20  Sharyon Cable, CNM  medroxyPROGESTERone (DEPO-PROVERA) 150 MG/ML injection Inject 1 mL (150 mg total) into the muscle every 3 (three) months. 11/26/12 11/30/18  Dessa Phi, MD    Family History Family History  Problem Relation Age of Onset   Healthy Mother    Hypertension Mother    Healthy Father    ADD / ADHD Brother     Social History Social History   Tobacco Use   Smoking status: Never   Smokeless tobacco: Never  Vaping Use   Vaping Use: Never used  Substance Use Topics   Alcohol use: No   Drug use: No  Allergies   Patient has no known allergies.   Review of Systems As stated in HPI otherwise negative   Physical Exam Triage Vital Signs ED Triage Vitals  Enc Vitals Group     BP 11/23/20 0942 122/84     Pulse Rate 11/23/20 0942 82     Resp 11/23/20 0942 19     Temp 11/23/20 0942 99 F (37.2 C)     Temp Source 11/23/20 0942 Oral     SpO2 11/23/20 0942 100 %     Weight --      Height --      Head Circumference --      Peak Flow --      Pain Score 11/23/20 0940 0     Pain Loc --      Pain Edu? --      Excl. in GC? --    No data found.  Updated Vital Signs BP 122/84 (BP Location: Left Arm)   Pulse 82   Temp 99 F (37.2 C) (Oral)   Resp 19   LMP  (LMP Unknown)   SpO2 100%   Visual Acuity Right Eye Distance:   Left Eye Distance:   Bilateral Distance:    Right Eye  Near:   Left Eye Near:    Bilateral Near:     Physical Exam Constitutional:      General: She is not in acute distress.    Appearance: She is well-developed. She is not toxic-appearing.  HENT:     Mouth/Throat:     Mouth: Mucous membranes are moist.     Pharynx: Oropharynx is clear.  Eyes:     Extraocular Movements: Extraocular movements intact.  Neck:     Comments: Left cervical adenopathy Cardiovascular:     Rate and Rhythm: Normal rate and regular rhythm.  Pulmonary:     Effort: Pulmonary effort is normal.     Breath sounds: Normal breath sounds.  Abdominal:     Palpations: Abdomen is soft.  Musculoskeletal:     Cervical back: Normal range of motion and neck supple.  Lymphadenopathy:     Cervical: Cervical adenopathy present.  Skin:    General: Skin is warm and dry.  Neurological:     Mental Status: She is alert.  Psychiatric:        Mood and Affect: Mood normal.        Behavior: Behavior normal.     UC Treatments / Results  Labs (all labs ordered are listed, but only abnormal results are displayed) Labs Reviewed  SARS CORONAVIRUS 2 (TAT 6-24 HRS)  CULTURE, GROUP A STREP [facility])  POCT RAPID STREP A, ED / UC    EKG   Radiology No results found.  Procedures Procedures (including critical care time)  Medications Ordered in UC Medications - No data to display  Initial Impression / Assessment and Plan / UC Course  I have reviewed the triage vital signs and the nursing notes.  Pertinent labs & imaging results that were available during my care of the patient were reviewed by me and considered in my medical decision making (see chart for details).  URI -VSS, nontoxic appearing.  Symptoms suspicious for COVID-19 versus other viral process -Rapid strep negative.  Will send for culture though low suspicion -COVID 19 PCR  -Supportive care with Tylenol/Motrin, cetirizine, Flonase, Tessalon Perles -Strict follow-up precautions discussed  Reviewed expections  re: course of current medical issues. Questions answered. Outlined signs and symptoms indicating need for more acute  intervention. Pt verbalized understanding. AVS given   Final Clinical Impressions(s) / UC Diagnoses   Final diagnoses:  Viral upper respiratory tract infection  Encounter for screening for COVID-19     Discharge Instructions      Your rapid strep test today was negative. COVID testing ordered. It will take between 1-2 days for test results. Someone will contact you regarding abnormal results or you can access your results through MyChart.   In the meantime you should... Remain isolated in your home for 5 days from symptom onset AND greater than 48 hours of no fever without the use of fever-reducing medication Get plenty of rest and fluids Tessalon Perles prescribed for cough Flonase for nasal congestion and/or runny nose You can take OTC Zyrtec-D for nasal congestion, runny nose, and/or sore throat Use these medications as directed for symptom relief Use Tylenol or Ibuprofen as needed for fever or pain Return or go to the ER for any worsening or new symptoms such as high fever, worsening cough, shortness of breath, chest tightness, chest pain, changes in mental status, etc.      ED Prescriptions     Medication Sig Dispense Auth. Provider   benzonatate (TESSALON PERLES) 100 MG capsule Take 1 capsule (100 mg total) by mouth 3 (three) times daily as needed for cough. 30 capsule Rolla Etienne, NP      PDMP not reviewed this encounter.   Rolla Etienne, NP 11/23/20 [phone]

## 2020-11-23 NOTE — ED Triage Notes (Signed)
Pt presents with c/o chest pain, headache, body aches, runny nose X 3 days.   States she took 3 tests and states all COVID test resulted negative.

## 2020-11-25 LAB — CULTURE, GROUP A STREP (THRC)

## 2020-12-08 ENCOUNTER — Other Ambulatory Visit: Payer: Self-pay

## 2020-12-08 ENCOUNTER — Ambulatory Visit (INDEPENDENT_AMBULATORY_CARE_PROVIDER_SITE_OTHER): Payer: No Typology Code available for payment source

## 2020-12-08 DIAGNOSIS — Z3042 Encounter for surveillance of injectable contraceptive: Secondary | ICD-10-CM

## 2020-12-08 MED ORDER — MEDROXYPROGESTERONE ACETATE 150 MG/ML IM SUSP
150.0000 mg | Freq: Once | INTRAMUSCULAR | Status: AC
  Administered 2020-12-08: 150 mg via INTRAMUSCULAR

## 2020-12-08 NOTE — Progress Notes (Signed)
Patient here today for Depo Provera injection and is within her dates.     Last contraceptive appt was 06/27/2020.   Depo given in [facility] today. Site unremarkable & patient tolerated injection.     Next injection due 02/23/2021-03/09/2021. Reminder card given.

## 2020-12-17 ENCOUNTER — Other Ambulatory Visit: Payer: Self-pay

## 2020-12-17 ENCOUNTER — Ambulatory Visit (HOSPITAL_COMMUNITY)
Admission: EM | Admit: 2020-12-17 | Discharge: 2020-12-17 | Disposition: A | Discharge status: Home / Self Care | Payer: Medicaid Other | Attending: Internal Medicine | Admitting: Internal Medicine

## 2020-12-17 ENCOUNTER — Encounter (HOSPITAL_COMMUNITY): Payer: Self-pay

## 2020-12-17 DIAGNOSIS — J029 Acute pharyngitis, unspecified: Secondary | ICD-10-CM | POA: Diagnosis present

## 2020-12-17 LAB — POCT RAPID STREP A, ED / UC: Streptococcus, Group A Screen (Direct): NEGATIVE

## 2020-12-17 NOTE — ED Triage Notes (Signed)
Pt present nasal congestion with sore throat and difficulty swallowing. Symptoms started last night.

## 2020-12-17 NOTE — Discharge Instructions (Signed)

## 2020-12-17 NOTE — ED Provider Notes (Signed)
[facility]    CSN: [id number] Arrival date & time: 12/17/20  1302      History   Chief Complaint Chief Complaint  Patient presents with   Sore Throat   Nasal Congestion    HPI Ann Boyd is a 20 y.o. female.   Patient here for evaluation of nasal congestion and sore throat that started yesterday.  Reports painful swallowing but managing secretions at this time.  Denies any recent sick contacts.  Denies any trauma, injury, or other precipitating event.  Denies any fevers, chest pain, shortness of breath, N/V/D, numbness, tingling, weakness, abdominal pain, or headaches.    The history is provided by the patient.  Sore Throat   Past Medical History:  Diagnosis Date   Hx of chlamydia infection 09/2018   Hx of gonorrhea 09/2018   Hx of trichomoniasis 09/2017   Medical history non-contributory     Patient Active Problem List   Diagnosis Date Noted   Contraceptive surveillance 06/27/2020   History of COVID-19 05/01/2020   History of gestational hypertension 05/01/2020   COVID-19 affecting pregnancy in third trimester 04/04/2020    Past Surgical History:  Procedure Laterality Date   NO PAST SURGERIES      OB History     Gravida  2   Para  1   Term  1   Preterm      AB  1   Living  1      SAB      IAB      Ectopic      Multiple  0   Live Births  1            Home Medications    Prior to Admission medications   Medication Sig Start Date End Date Taking? Authorizing Provider  benzonatate (TESSALON PERLES) 100 MG capsule Take 1 capsule (100 mg total) by mouth 3 (three) times daily as needed for cough. 11/23/20 11/23/21  Rolla Etienne, NP  NIFEdipine (ADALAT CC) 30 MG 24 hr tablet TAKE 1 TABLET (30 MG TOTAL) BY MOUTH DAILY. 04/04/20 04/04/21  Gita Kudo, MD  NIFEdipine (PROCARDIA-XL/NIFEDICAL-XL) 30 MG 24 hr tablet Take 1 tablet (30 mg total) by mouth daily. Patient not taking: Reported on 05/22/2020 04/04/20   Gita Kudo,  MD  Prenat-FeCbn-FeAsp-Meth-FA-DHA (PRENATE MINI) 18-0.6-0.4-350 MG CAPS Take 1 capsule by mouth daily. Patient not taking: Reported on 05/22/2020 01/10/20   Hurshel Party, CNM  ferrous gluconate (FERGON) 324 MG tablet Take 1 tablet (324 mg total) by mouth daily with breakfast. Patient not taking: Reported on 05/01/2020 09/13/19 05/09/20  Sharyon Cable, CNM  medroxyPROGESTERone (DEPO-PROVERA) 150 MG/ML injection Inject 1 mL (150 mg total) into the muscle every 3 (three) months. 11/26/12 11/30/18  Dessa Phi, MD    Family History Family History  Problem Relation Age of Onset   Healthy Mother    Hypertension Mother    Healthy Father    ADD / ADHD Brother     Social History Social History   Tobacco Use   Smoking status: Never   Smokeless tobacco: Never  Vaping Use   Vaping Use: Never used  Substance Use Topics   Alcohol use: No   Drug use: No     Allergies   Patient has no known allergies.   Review of Systems Review of Systems  HENT:  Positive for congestion, sore throat and trouble swallowing.   All other systems reviewed and are negative.   Physical  Exam Triage Vital Signs ED Triage Vitals  Enc Vitals Group     BP 12/17/20 1342 124/88     Pulse Rate 12/17/20 1342 86     Resp --      Temp 12/17/20 1342 98.3 F (36.8 C)     Temp Source 12/17/20 1342 Oral     SpO2 12/17/20 1342 98 %     Weight --      Height --      Head Circumference --      Peak Flow --      Pain Score 12/17/20 1354 8     Pain Loc --      Pain Edu? --      Excl. in GC? --    No data found.  Updated Vital Signs BP 124/88 (BP Location: Left Arm)   Pulse 86   Temp 98.3 F (36.8 C) (Oral)   LMP  (LMP Unknown)   SpO2 98%   Visual Acuity Right Eye Distance:   Left Eye Distance:   Bilateral Distance:    Right Eye Near:   Left Eye Near:    Bilateral Near:     Physical Exam Vitals and nursing note reviewed.  Constitutional:      General: She is not in acute  distress.    Appearance: Normal appearance. She is not ill-appearing, toxic-appearing or diaphoretic.  HENT:     Head: Normocephalic and atraumatic.     Nose: Congestion present. No rhinorrhea.     Mouth/Throat:     Pharynx: Uvula midline. Pharyngeal swelling and posterior oropharyngeal erythema present.     Tonsils: No tonsillar exudate or tonsillar abscesses. 1+ on the right. 1+ on the left.  Eyes:     Conjunctiva/sclera: Conjunctivae normal.  Cardiovascular:     Rate and Rhythm: Normal rate and regular rhythm.     Pulses: Normal pulses.     Heart sounds: Normal heart sounds.  Pulmonary:     Effort: Pulmonary effort is normal.     Breath sounds: Normal breath sounds.  Abdominal:     General: Abdomen is flat.  Musculoskeletal:        General: Normal range of motion.     Cervical back: Normal range of motion.  Skin:    General: Skin is warm and dry.  Neurological:     General: No focal deficit present.     Mental Status: She is alert and oriented to person, place, and time.  Psychiatric:        Mood and Affect: Mood normal.     UC Treatments / Results  Labs (all labs ordered are listed, but only abnormal results are displayed) Labs Reviewed  CULTURE, GROUP A STREP [facility])  POCT RAPID STREP A, ED / UC    EKG   Radiology No results found.  Procedures Procedures (including critical care time)  Medications Ordered in UC Medications - No data to display  Initial Impression / Assessment and Plan / UC Course  I have reviewed the triage vital signs and the nursing notes.  Pertinent labs & imaging results that were available during my care of the patient were reviewed by me and considered in my medical decision making (see chart for details).    Assessment negative for red flags or concerns.  Likely viral pharyngitis.  Rapid strep negative, throat culture pending.  Declined any COVID testing at this time.  Tylenol and or ibuprofen as needed.  Discussed conservative  symptom management as described in  discharge instructions.  Encourage fluids and rest.  Follow-up as needed Final Clinical Impressions(s) / UC Diagnoses   Final diagnoses:  Viral pharyngitis     Discharge Instructions      You can take Tylenol and/or Ibuprofen as needed for fever reduction and pain relief.   For cough: honey 1/2 to 1 teaspoon (you can dilute the honey in water or another fluid).  You can also use guaifenesin and dextromethorphan for cough. You can use a humidifier for chest congestion and cough.  If you don't have a humidifier, you can sit in the bathroom with the hot shower running.     For sore throat: try warm salt water gargles, cepacol lozenges, throat spray, warm tea or water with lemon/honey, popsicles or ice, or OTC cold relief medicine for throat discomfort.    For congestion: take a daily anti-histamine like Zyrtec, Claritin, and a oral decongestant, such as pseudoephedrine.  You can also use Flonase 1-2 sprays in each nostril daily.    It is important to stay hydrated: drink plenty of fluids (water, gatorade/powerade/pedialyte, juices, or teas) to keep your throat moisturized and help further relieve irritation/discomfort.   Return or go to the Emergency Department if symptoms worsen or do not improve in the next few days.      ED Prescriptions   None    PDMP not reviewed this encounter.   Ivette Loyal, NP 12/17/20 1742

## 2020-12-19 LAB — CULTURE, GROUP A STREP (THRC)

## 2020-12-21 ENCOUNTER — Telehealth (HOSPITAL_COMMUNITY): Payer: Self-pay | Admitting: Emergency Medicine

## 2020-12-21 MED ORDER — PENICILLIN V POTASSIUM 500 MG PO TABS: 500.0000 mg | ORAL_TABLET | Freq: Two times a day (BID) | ORAL | 0 refills | Status: AC

## 2021-01-07 ENCOUNTER — Ambulatory Visit (HOSPITAL_COMMUNITY)
Admission: RE | Admit: 2021-01-07 | Discharge: 2021-01-07 | Disposition: A | Discharge status: Home / Self Care | Payer: Medicaid Other | Source: Ambulatory Visit | Attending: Student | Admitting: Student

## 2021-01-07 ENCOUNTER — Other Ambulatory Visit: Payer: Self-pay

## 2021-01-07 ENCOUNTER — Encounter (HOSPITAL_COMMUNITY): Payer: Self-pay

## 2021-01-07 VITALS — BP 137/89 | HR 78 | Temp 99.0°F | Resp 16

## 2021-01-07 DIAGNOSIS — B3731 Acute candidiasis of vulva and vagina: Secondary | ICD-10-CM | POA: Insufficient documentation

## 2021-01-07 MED ORDER — FLUCONAZOLE 150 MG PO TABS: 150.0000 mg | ORAL_TABLET | Freq: Every day | ORAL | 0 refills | Status: DC

## 2021-01-07 NOTE — Discharge Instructions (Addendum)
-  For your yeast infection, start the Diflucan (fluconazole)- Take one pill today (day 1). If you're still having symptoms in 3 days, take the second pill.  -we'll call in 2 days if anything else is positive. -Abstain from intercourse until symptoms resolve.

## 2021-01-07 NOTE — ED Triage Notes (Signed)
Pt reports white chunky vaginal discharge and vaginal itching x 2 days.

## 2021-01-07 NOTE — ED Provider Notes (Signed)
[facility]    CSN: [id number] Arrival date & time: 01/07/21  1404      History   Chief Complaint Chief Complaint  Patient presents with   Appointment    1400   Vaginal Discharge    HPI Ann Boyd is a 20 y.o. female presenting with 2 days of thick white vaginal discharge, no smell.  Medical history chlamydia, gonorrhea, trichomoniasis, vaginal candidiasis.  Last screening was 5 months ago, positive for yeast at that time.  States that she did recently use new products as she was staying at a friend's house.  Denies STI risk at this time.  Denies urinary symptoms including frequency, urgency, suprapubic pain, flank pain, fever/chills.  HPI  Past Medical History:  Diagnosis Date   Hx of chlamydia infection 09/2018   Hx of gonorrhea 09/2018   Hx of trichomoniasis 09/2017   Medical history non-contributory     Patient Active Problem List   Diagnosis Date Noted   Contraceptive surveillance 06/27/2020   History of COVID-19 05/01/2020   History of gestational hypertension 05/01/2020   COVID-19 affecting pregnancy in third trimester 04/04/2020    Past Surgical History:  Procedure Laterality Date   NO PAST SURGERIES      OB History     Gravida  2   Para  1   Term  1   Preterm      AB  1   Living  1      SAB      IAB      Ectopic      Multiple  0   Live Births  1            Home Medications    Prior to Admission medications   Medication Sig Start Date End Date Taking? Authorizing Provider  fluconazole (DIFLUCAN) 150 MG tablet Take 1 tablet (150 mg total) by mouth daily. -For your yeast infection, start the Diflucan (fluconazole)- Take one pill today (day 1). If you're still having symptoms in 3 days, take the second pill. 01/07/21  Yes Rhys Martini, PA-C  benzonatate (TESSALON PERLES) 100 MG capsule Take 1 capsule (100 mg total) by mouth 3 (three) times daily as needed for cough. 11/23/20 11/23/21  Rolla Etienne, NP  NIFEdipine  (ADALAT CC) 30 MG 24 hr tablet TAKE 1 TABLET (30 MG TOTAL) BY MOUTH DAILY. 04/04/20 04/04/21  Gita Kudo, MD  NIFEdipine (PROCARDIA-XL/NIFEDICAL-XL) 30 MG 24 hr tablet Take 1 tablet (30 mg total) by mouth daily. Patient not taking: No sig reported 04/04/20   Gita Kudo, MD  Prenat-FeCbn-FeAsp-Meth-FA-DHA (PRENATE MINI) 18-0.6-0.4-350 MG CAPS Take 1 capsule by mouth daily. Patient not taking: No sig reported 01/10/20   Leftwich-Kirby, Wilmer Floor, CNM  ferrous gluconate (FERGON) 324 MG tablet Take 1 tablet (324 mg total) by mouth daily with breakfast. Patient not taking: Reported on 05/01/2020 09/13/19 05/09/20  Sharyon Cable, CNM  medroxyPROGESTERone (DEPO-PROVERA) 150 MG/ML injection Inject 1 mL (150 mg total) into the muscle every 3 (three) months. 11/26/12 11/30/18  Dessa Phi, MD    Family History Family History  Problem Relation Age of Onset   Healthy Mother    Hypertension Mother    Healthy Father    ADD / ADHD Brother     Social History Social History   Tobacco Use   Smoking status: Never   Smokeless tobacco: Never  Vaping Use   Vaping Use: Never used  Substance Use Topics   Alcohol  use: No   Drug use: No     Allergies   Patient has no known allergies.   Review of Systems Review of Systems  Constitutional:  Negative for chills and fever.  HENT:  Negative for sore throat.   Eyes:  Negative for pain and redness.  Respiratory:  Negative for shortness of breath.   Cardiovascular:  Negative for chest pain.  Gastrointestinal:  Negative for abdominal pain, diarrhea, nausea and vomiting.  Genitourinary:  Positive for vaginal discharge. Negative for decreased urine volume, difficulty urinating, dysuria, flank pain, frequency, genital sores, hematuria, urgency, vaginal bleeding and vaginal pain.  Musculoskeletal:  Negative for back pain.  Skin:  Negative for rash.  All other systems reviewed and are negative.   Physical Exam Triage Vital Signs ED Triage Vitals   Enc Vitals Group     BP 01/07/21 1443 137/89     Pulse Rate 01/07/21 1443 78     Resp 01/07/21 1443 16     Temp 01/07/21 1443 99 F (37.2 C)     Temp Source 01/07/21 1443 Oral     SpO2 01/07/21 1443 99 %     Weight --      Height --      Head Circumference --      Peak Flow --      Pain Score 01/07/21 1444 0     Pain Loc --      Pain Edu? --      Excl. in GC? --    No data found.  Updated Vital Signs BP 137/89 (BP Location: Right Arm)   Pulse 78   Temp 99 F (37.2 C) (Oral)   Resp 16   SpO2 99%   Visual Acuity Right Eye Distance:   Left Eye Distance:   Bilateral Distance:    Right Eye Near:   Left Eye Near:    Bilateral Near:     Physical Exam Vitals reviewed.  Constitutional:      General: She is not in acute distress.    Appearance: Normal appearance. She is not ill-appearing.  HENT:     Head: Normocephalic and atraumatic.     Mouth/Throat:     Mouth: Mucous membranes are moist.     Comments: Moist mucous membranes Eyes:     Extraocular Movements: Extraocular movements intact.     Pupils: Pupils are equal, round, and reactive to light.  Cardiovascular:     Rate and Rhythm: Normal rate and regular rhythm.     Heart sounds: Normal heart sounds.  Pulmonary:     Effort: Pulmonary effort is normal.     Breath sounds: Normal breath sounds. No wheezing, rhonchi or rales.  Abdominal:     General: Bowel sounds are normal. There is no distension.     Palpations: Abdomen is soft. There is no mass.     Tenderness: There is no abdominal tenderness. There is no right CVA tenderness, left CVA tenderness, guarding or rebound.  Genitourinary:    Comments: deferred Skin:    General: Skin is warm.     Capillary Refill: Capillary refill takes less than 2 seconds.     Comments: Good skin turgor  Neurological:     General: No focal deficit present.     Mental Status: She is alert and oriented to person, place, and time.  Psychiatric:        Mood and Affect: Mood  normal.        Behavior: Behavior normal.  UC Treatments / Results  Labs (all labs ordered are listed, but only abnormal results are displayed) Labs Reviewed  CERVICOVAGINAL ANCILLARY ONLY    EKG   Radiology No results found.  Procedures Procedures (including critical care time)  Medications Ordered in UC Medications - No data to display  Initial Impression / Assessment and Plan / UC Course  I have reviewed the triage vital signs and the nursing notes.  Pertinent labs & imaging results that were available during my care of the patient were reviewed by me and considered in my medical decision making (see chart for details).     This patient is a very pleasant 20 y.o. year old female presenting with suspected vaginal candidiasis. Afebrile, nontachycardic, no reproducible abd pain or CVAT.  Last positive for vaginal candida 07/2020. Also with history gonorrhea, chlamydia, trichomonas. Will send self-swab for G/C, trich, yeast, BV testing. Declines HIV, RPR. Safe sex precautions.   Will manage with diflucan pending test results.   ED return precautions discussed. Patient verbalizes understanding and agreement.   Coding Level 4 for review of past notes/labs, order and interpretation of labs today, and prescription drug management   Final Clinical Impressions(s) / UC Diagnoses   Final diagnoses:  Vaginal candidiasis     Discharge Instructions      -For your yeast infection, start the Diflucan (fluconazole)- Take one pill today (day 1). If you're still having symptoms in 3 days, take the second pill.  -we'll call in 2 days if anything else is positive. -Abstain from intercourse until symptoms resolve.   ED Prescriptions     Medication Sig Dispense Auth. Provider   fluconazole (DIFLUCAN) 150 MG tablet Take 1 tablet (150 mg total) by mouth daily. -For your yeast infection, start the Diflucan (fluconazole)- Take one pill today (day 1). If you're still having  symptoms in 3 days, take the second pill. 2 tablet Rhys Martini, PA-C      PDMP not reviewed this encounter.   Rhys Martini, PA-C 01/07/21 1548

## 2021-01-08 LAB — CERVICOVAGINAL ANCILLARY ONLY
Bacterial Vaginitis (gardnerella): NEGATIVE
Candida Glabrata: NEGATIVE
Candida Vaginitis: POSITIVE — AB
Chlamydia: NEGATIVE
Comment: NEGATIVE
Comment: NEGATIVE
Comment: NEGATIVE
Comment: NEGATIVE
Comment: NEGATIVE
Comment: NORMAL
Neisseria Gonorrhea: NEGATIVE
Trichomonas: NEGATIVE

## 2021-02-26 ENCOUNTER — Other Ambulatory Visit: Payer: Self-pay

## 2021-02-26 ENCOUNTER — Ambulatory Visit (INDEPENDENT_AMBULATORY_CARE_PROVIDER_SITE_OTHER): Payer: Medicaid Other

## 2021-02-26 DIAGNOSIS — Z3042 Encounter for surveillance of injectable contraceptive: Secondary | ICD-10-CM | POA: Diagnosis present

## 2021-02-26 MED ORDER — MEDROXYPROGESTERONE ACETATE 150 MG/ML IM SUSP
150.0000 mg | Freq: Once | INTRAMUSCULAR | Status: AC
  Administered 2021-02-26: 16:00:00 150 mg via INTRAMUSCULAR

## 2021-02-26 NOTE — Progress Notes (Signed)
Patient here today for Depo Provera injection and is within her dates.    Last contraceptive appt was 05/2020.  Depo given in RUOQ today. Site unremarkable & patient tolerated injection.    Next injection due 05/14/2021-05/28/2021. Reminder card given.

## 2021-04-02 ENCOUNTER — Encounter (HOSPITAL_COMMUNITY): Payer: Self-pay

## 2021-04-02 ENCOUNTER — Ambulatory Visit (HOSPITAL_COMMUNITY)
Admission: RE | Admit: 2021-04-02 | Discharge: 2021-04-02 | Disposition: A | Discharge status: Home / Self Care | Payer: Medicaid Other | Source: Ambulatory Visit | Attending: Family Medicine | Admitting: Family Medicine

## 2021-04-02 ENCOUNTER — Other Ambulatory Visit: Payer: Self-pay

## 2021-04-02 VITALS — BP 130/91 | HR 78 | Temp 98.1°F | Resp 18

## 2021-04-02 DIAGNOSIS — N939 Abnormal uterine and vaginal bleeding, unspecified: Secondary | ICD-10-CM

## 2021-04-02 MED ORDER — MEGESTROL ACETATE 40 MG PO TABS: 40.0000 mg | ORAL_TABLET | Freq: Every day | ORAL | 0 refills | Status: AC

## 2021-04-02 NOTE — Discharge Instructions (Signed)
We have sent testing for sexually transmitted infections. We will notify you of any positive results once they are received. If required, we will prescribe any medications you might need.  Please refrain from all sexual activity for at least the next seven days.  

## 2021-04-02 NOTE — ED Triage Notes (Signed)
Pt presents with recurrent abnormal vaginal bleeding.

## 2021-04-02 NOTE — ED Provider Notes (Signed)
°  [facility]   [id number] 04/02/21 Arrival Time: 1000  ASSESSMENT & PLAN:  1. Abnormal vaginal bleeding    VSS. Plans to schedule follow up with PCP asap.  Vaginal cytology pending. Begin: Meds ordered this encounter  Medications   megestrol (MEGACE) 40 MG tablet    Sig: Take 1 tablet (40 mg total) by mouth daily for 14 days.    Dispense:  14 tablet    Refill:  0      Discharge Instructions      We have sent testing for sexually transmitted infections. We will notify you of any positive results once they are received. If required, we will prescribe any medications you might need.  Please refrain from all sexual activity for at least the next seven days.     Without s/s of PID.  Labs Reviewed  CERVICOVAGINAL ANCILLARY ONLY    Reviewed expectations re: course of current medical issues. Questions answered. Outlined signs and symptoms indicating need for more acute intervention. Patient verbalized understanding. After Visit Summary given.   SUBJECTIVE:  Ann Boyd is a 21 y.o. female on Depo who presents with complaint of sporadic vaginal bleeding. On/off. H/O similar on Depo but resolved without treatment. Otherwise feeling well. Afebrile. No abdominal or pelvic pain. Normal PO intake wihout n/v. No genital rashes or lesions. No LMP recorded. Patient has had an injection.   OBJECTIVE:  Vitals:   04/02/21 1037  BP: (!) 130/91  Pulse: 78  Resp: 18  Temp: 98.1 F (36.7 C)  TempSrc: Oral  SpO2: 99%     General appearance: alert, cooperative, appears stated age and no distress Lungs: unlabored respirations; speaks full sentences without difficulty Back: no CVA tenderness; FROM at waist Abdomen: soft, non-tender GU: deferred Skin: warm and dry Psychological: alert and cooperative; normal mood and affect.  Labs Reviewed  CERVICOVAGINAL ANCILLARY ONLY    No Known Allergies  Past Medical History:  Diagnosis Date   Hx of chlamydia  infection 09/2018   Hx of gonorrhea 09/2018   Hx of trichomoniasis 09/2017   Medical history non-contributory    Family History  Problem Relation Age of Onset   Healthy Mother    Hypertension Mother    Healthy Father    ADD / ADHD Brother    Social History   Socioeconomic History   Marital status: Single    Spouse name: Not on file   Number of children: Not on file   Years of education: Not on file   Highest education level: Not on file  Occupational History   Occupation: [employer]  Tobacco Use   Smoking status: Never   Smokeless tobacco: Never  Vaping Use   Vaping Use: Never used  Substance and Sexual Activity   Alcohol use: No   Drug use: No   Sexual activity: Yes    Birth control/protection: Injection  Other Topics Concern   Not on file  Social History Narrative   Not on file   Social Determinants of Health   Financial Resource Strain: Not on file  Food Insecurity: Not on file  Transportation Needs: Not on file  Physical Activity: Not on file  Stress: Not on file  Social Connections: Not on file  Intimate Partner Violence: Not on file           [place], MD 04/02/21 1254

## 2021-04-03 ENCOUNTER — Telehealth: Payer: Self-pay | Admitting: Family Medicine

## 2021-04-03 DIAGNOSIS — A749 Chlamydial infection, unspecified: Secondary | ICD-10-CM

## 2021-04-03 LAB — CERVICOVAGINAL ANCILLARY ONLY
Bacterial Vaginitis (gardnerella): NEGATIVE
Candida Glabrata: NEGATIVE
Candida Vaginitis: NEGATIVE
Chlamydia: POSITIVE — AB
Comment: NEGATIVE
Comment: NEGATIVE
Comment: NEGATIVE
Comment: NEGATIVE
Comment: NEGATIVE
Comment: NORMAL
Neisseria Gonorrhea: NEGATIVE
Trichomonas: NEGATIVE

## 2021-04-03 MED ORDER — DOXYCYCLINE HYCLATE 100 MG PO TABS: 100.0000 mg | ORAL_TABLET | Freq: Two times a day (BID) | ORAL | 0 refills | Status: AC

## 2021-04-03 NOTE — Telephone Encounter (Signed)
Call patient to relay positive chlamydia result from cervical vaginal swab at urgent care yesterday.  Sent in doxycycline twice daily x7 days.  Discussed treatment with patient.  All questions answered.  Return in 3 months for test of cure.  Ezequiel Essex, MD

## 2021-05-09 ENCOUNTER — Ambulatory Visit (INDEPENDENT_AMBULATORY_CARE_PROVIDER_SITE_OTHER): Payer: Medicaid Other | Admitting: Family Medicine

## 2021-05-09 ENCOUNTER — Other Ambulatory Visit (HOSPITAL_COMMUNITY)
Admission: RE | Admit: 2021-05-09 | Discharge: 2021-05-09 | Disposition: A | Discharge status: Home / Self Care | Payer: Medicaid Other | Source: Ambulatory Visit | Attending: Family Medicine | Admitting: Family Medicine

## 2021-05-09 ENCOUNTER — Other Ambulatory Visit: Payer: Self-pay

## 2021-05-09 VITALS — BP 137/83 | HR 91 | Wt 178.4 lb

## 2021-05-09 DIAGNOSIS — Z113 Encounter for screening for infections with a predominantly sexual mode of transmission: Secondary | ICD-10-CM | POA: Insufficient documentation

## 2021-05-09 DIAGNOSIS — Z1159 Encounter for screening for other viral diseases: Secondary | ICD-10-CM

## 2021-05-09 DIAGNOSIS — Z8619 Personal history of other infectious and parasitic diseases: Secondary | ICD-10-CM

## 2021-05-09 NOTE — Patient Instructions (Signed)
Thank you for coming in today.  When test are available I will notify you via MyChart or phone.  Follow-up with PCP on elevated blood pressure reading.  Dr. Salvadore Dom

## 2021-05-09 NOTE — Progress Notes (Signed)
° ° °  SUBJECTIVE:   CHIEF COMPLAINT / HPI:   Desires STD screening.  Denies symptoms of dysuria, vaginal discharge, abnormal vaginal bleeding.  States has history of chlamydia and would like to be retested.  States that time of infection she abstain from sex and partner was treated.  Uses Depo for birth control.  PERTINENT  PMH / PSH: As above.  OBJECTIVE:   BP 137/83    Pulse 91    Wt 178 lb 6.4 oz (80.9 kg)    SpO2 100%    BMI 29.69 kg/m   Physical Exam Vitals reviewed. Exam conducted with a chaperone present.  Pulmonary:     Effort: Pulmonary effort is normal.  Genitourinary:    General: Normal vulva.     Exam position: Lithotomy position.     Vagina: Vaginal discharge present. No lesions.     Cervix: Friability present. No discharge or lesion.  Neurological:     Mental Status: She is alert and oriented to person, place, and time.  Psychiatric:        Mood and Affect: Mood normal.        Behavior: Behavior normal.   ASSESSMENT/PLAN:   1. History of chlamydia +1/2.  States that she and her partner were treated.  Would like to be retested. - Cervicovaginal ancillary only  2. Screening for viral disease - HIV antibody (with reflex)  3. Screening examination for sexually transmitted disease - HIV antibody (with reflex) - Cervicovaginal ancillary only - RPR  Advised to return for Depo shot at the end of the month.  Lavonda Jumbo, DO [facility]

## 2021-05-10 LAB — HIV ANTIBODY (ROUTINE TESTING W REFLEX): HIV Screen 4th Generation wRfx: NONREACTIVE

## 2021-05-10 LAB — RPR: RPR Ser Ql: NONREACTIVE

## 2021-05-11 LAB — CERVICOVAGINAL ANCILLARY ONLY
Bacterial Vaginitis (gardnerella): POSITIVE — AB
Candida Glabrata: NEGATIVE
Candida Vaginitis: POSITIVE — AB
Chlamydia: NEGATIVE
Comment: NEGATIVE
Comment: NEGATIVE
Comment: NEGATIVE
Comment: NEGATIVE
Comment: NEGATIVE
Comment: NORMAL
Neisseria Gonorrhea: NEGATIVE
Trichomonas: POSITIVE — AB

## 2021-05-14 ENCOUNTER — Encounter: Payer: Self-pay | Admitting: Family Medicine

## 2021-05-14 ENCOUNTER — Other Ambulatory Visit: Payer: Self-pay | Admitting: Family Medicine

## 2021-05-14 MED ORDER — FLUCONAZOLE 150 MG PO TABS: 150.0000 mg | ORAL_TABLET | Freq: Once | ORAL | 0 refills | Status: AC

## 2021-05-14 MED ORDER — METRONIDAZOLE 500 MG PO TABS: 500.0000 mg | ORAL_TABLET | Freq: Two times a day (BID) | ORAL | 0 refills | Status: AC

## 2021-05-23 ENCOUNTER — Ambulatory Visit (INDEPENDENT_AMBULATORY_CARE_PROVIDER_SITE_OTHER): Payer: Medicaid Other

## 2021-05-23 ENCOUNTER — Other Ambulatory Visit: Payer: Self-pay

## 2021-05-23 DIAGNOSIS — Z3042 Encounter for surveillance of injectable contraceptive: Secondary | ICD-10-CM | POA: Diagnosis present

## 2021-05-23 MED ORDER — MEDROXYPROGESTERONE ACETATE 150 MG/ML IM SUSP
150.0000 mg | Freq: Once | INTRAMUSCULAR | Status: AC
  Administered 2021-05-23: 150 mg via INTRAMUSCULAR

## 2021-05-23 NOTE — Progress Notes (Signed)
Patient here today for Depo Provera injection and is within her dates.    Last contraceptive appt was 05/09/2021  Depo given in LUOQ  today.  Site unremarkable & patient tolerated injection.    Next injection due 08/08/21 - 08/22/21.  Reminder card given.    Veronda Prude, RN

## 2021-05-30 ENCOUNTER — Other Ambulatory Visit: Payer: Self-pay

## 2021-05-30 ENCOUNTER — Ambulatory Visit (HOSPITAL_COMMUNITY)
Admission: RE | Admit: 2021-05-30 | Discharge: 2021-05-30 | Disposition: A | Discharge status: Home / Self Care | Payer: Medicaid Other | Source: Ambulatory Visit | Attending: Family Medicine | Admitting: Family Medicine

## 2021-05-30 VITALS — BP 136/87 | HR 83 | Temp 98.7°F | Resp 16

## 2021-05-30 DIAGNOSIS — N898 Other specified noninflammatory disorders of vagina: Secondary | ICD-10-CM | POA: Diagnosis not present

## 2021-05-30 NOTE — Discharge Instructions (Addendum)
You were seen today for vaginal discharge.  Given you were just treated, we will await the results of the swab today. This will be resulted tomorrow and you will be notified of results via Mychart.  If there is need for treatment we will call and notify you.  ?

## 2021-05-30 NOTE — ED Triage Notes (Signed)
Pt presents for vaginal discharge.Pt completed her last course of antibiotic but she is still having symptoms of odor and discharge.  ?

## 2021-05-30 NOTE — ED Provider Notes (Signed)
?[facility] ? ? ? ?CSN: [id number] ?Arrival date & time: 05/30/21  1402 ? ? ?  ? ?History   ?Chief Complaint ?Chief Complaint  ?Patient presents with  ? Vaginal Discharge  ? ? ?HPI ?Maansi Wike is a 21 y.o. female.  ? ?Patient is here for vaginal d/c.  ?She was seen several weeks ago by her pcp.  No vaginal symptoms at that time, but test showed yeast, bv and trich.  Finished treatment for those, and while she was taking them she started with gray discharge and odor.  No vaginal itching, but irritation.  ?She states her partner was also treated for the same things, and no intercourse until after treatment.  ?No fever, chills or abd pain.  ?No urinary symptoms noted.   ? ?Past Medical History:  ?Diagnosis Date  ? Hx of chlamydia infection 09/2018  ? Hx of gonorrhea 09/2018  ? Hx of trichomoniasis 09/2017  ? Medical history non-contributory   ? ? ?Patient Active Problem List  ? Diagnosis Date Noted  ? Contraceptive surveillance 06/27/2020  ? History of COVID-19 05/01/2020  ? History of gestational hypertension 05/01/2020  ? COVID-19 affecting pregnancy in third trimester 04/04/2020  ? ? ?Past Surgical History:  ?Procedure Laterality Date  ? NO PAST SURGERIES    ? ? ?OB History   ? ? Gravida  ?2  ? Para  ?1  ? Term  ?1  ? Preterm  ?   ? AB  ?1  ? Living  ?1  ?  ? ? SAB  ?   ? IAB  ?   ? Ectopic  ?   ? Multiple  ?0  ? Live Births  ?1  ?   ?  ?  ? ? ? ?Home Medications   ? ?Prior to Admission medications   ?Medication Sig Start Date End Date Taking? Authorizing Provider  ?benzonatate (TESSALON PERLES) 100 MG capsule Take 1 capsule (100 mg total) by mouth 3 (three) times daily as needed for cough. 11/23/20 11/23/21  Rolla Etienne, NP  ?NIFEdipine (ADALAT CC) 30 MG 24 hr tablet TAKE 1 TABLET (30 MG TOTAL) BY MOUTH DAILY. 04/04/20 04/04/21  Gita Kudo, MD  ?NIFEdipine (PROCARDIA-XL/NIFEDICAL-XL) 30 MG 24 hr tablet Take 1 tablet (30 mg total) by mouth daily. ?Patient not taking: No sig reported 04/04/20   Gita Kudo, MD  ?Prenat-FeCbn-FeAsp-Meth-FA-DHA (PRENATE MINI) 18-0.6-0.4-350 MG CAPS Take 1 capsule by mouth daily. ?Patient not taking: No sig reported 01/10/20   Hurshel Party, CNM  ?ferrous gluconate (FERGON) 324 MG tablet Take 1 tablet (324 mg total) by mouth daily with breakfast. ?Patient not taking: Reported on 05/01/2020 09/13/19 05/09/20  Sharyon Cable, CNM  ?medroxyPROGESTERone (DEPO-PROVERA) 150 MG/ML injection Inject 1 mL (150 mg total) into the muscle every 3 (three) months. 11/26/12 11/30/18  Dessa Phi, MD  ? ? ?Family History ?Family History  ?Problem Relation Age of Onset  ? Healthy Mother   ? Hypertension Mother   ? Healthy Father   ? ADD / ADHD Brother   ? ? ?Social History ?Social History  ? ?Tobacco Use  ? Smoking status: Never  ? Smokeless tobacco: Never  ?Vaping Use  ? Vaping Use: Never used  ?Substance Use Topics  ? Alcohol use: No  ? Drug use: No  ? ? ? ?Allergies   ?Patient has no known allergies. ? ? ?Review of Systems ?Review of Systems  ?Constitutional: Negative.   ?HENT: Negative.    ?  Respiratory: Negative.    ?Cardiovascular: Negative.   ?Gastrointestinal: Negative.   ?Genitourinary:  Positive for vaginal discharge.  ? ? ?Physical Exam ?Triage Vital Signs ?ED Triage Vitals  ?Enc Vitals Group  ?   BP 05/30/21 1429 136/87  ?   Pulse Rate 05/30/21 1429 83  ?   Resp 05/30/21 1429 16  ?   Temp 05/30/21 1429 98.7 ?F (37.1 ?C)  ?   Temp Source 05/30/21 1429 Oral  ?   SpO2 05/30/21 1429 100 %  ?   Weight --   ?   Height --   ?   Head Circumference --   ?   Peak Flow --   ?   Pain Score 05/30/21 1430 0  ?   Pain Loc --   ?   Pain Edu? --   ?   Excl. in GC? --   ? ?No data found. ? ?Updated Vital Signs ?BP 136/87 (BP Location: Left Arm)   Pulse 83   Temp 98.7 ?F (37.1 ?C) (Oral)   Resp 16   SpO2 100%  ? ?Visual Acuity ?Right Eye Distance:   ?Left Eye Distance:   ?Bilateral Distance:   ? ?Right Eye Near:   ?Left Eye Near:    ?Bilateral Near:    ? ?Physical Exam ?Constitutional:   ?    Appearance: Normal appearance.  ?Pulmonary:  ?   Effort: Pulmonary effort is normal.  ?Neurological:  ?   General: No focal deficit present.  ?   Mental Status: She is alert.  ?Psychiatric:     ?   Mood and Affect: Mood normal.     ?   Behavior: Behavior normal.  ? ? ? ?UC Treatments / Results  ?Labs ?(all labs ordered are listed, but only abnormal results are displayed) ?Labs Reviewed - No data to display ? ?EKG ? ? ?Radiology ?No results found. ? ?Procedures ?Procedures (including critical care time) ? ?Medications Ordered in UC ?Medications - No data to display ? ?Initial Impression / Assessment and Plan / UC Course  ?I have reviewed the triage vital signs and the nursing notes. ? ?Pertinent labs & imaging results that were available during my care of the patient were reviewed by me and considered in my medical decision making (see chart for details). ? ? Patient was seen today for vaginal d/c.  She recently saw her pcp and was treated for bv, yeast and trich.  After completion she started with vaginal d/c.  She did not have intercourse with her partner until treatment was completed.   ?No treatment at this time.  Will await results and treat based on those. She is aware and agrees.  ? ?Final Clinical Impressions(s) / UC Diagnoses  ? ?Final diagnoses:  ?Vaginal discharge  ? ? ? ?Discharge Instructions   ? ?  ?You were seen today for vaginal discharge.  Given you were just treated, we will await the results of the swab today. This will be resulted tomorrow and you will be notified of results via Mychart.  If there is need for treatment we will call and notify you.  ? ? ? ?ED Prescriptions   ?None ?  ? ?PDMP not reviewed this encounter. ?  Jannifer Franklin, MD ?05/30/21 1448 ? ?

## 2021-06-01 ENCOUNTER — Telehealth: Payer: Self-pay

## 2021-06-01 LAB — CERVICOVAGINAL ANCILLARY ONLY
Bacterial Vaginitis (gardnerella): NEGATIVE
Candida Glabrata: NEGATIVE
Candida Vaginitis: POSITIVE — AB
Chlamydia: NEGATIVE
Comment: NEGATIVE
Comment: NEGATIVE
Comment: NEGATIVE
Comment: NEGATIVE
Comment: NEGATIVE
Comment: NORMAL
Neisseria Gonorrhea: NEGATIVE
Trichomonas: NEGATIVE

## 2021-06-01 MED ORDER — FLUCONAZOLE 150 MG PO TABS: 150.0000 mg | ORAL_TABLET | Freq: Once | ORAL | 0 refills | Status: AC

## 2021-08-08 ENCOUNTER — Ambulatory Visit (INDEPENDENT_AMBULATORY_CARE_PROVIDER_SITE_OTHER): Payer: Medicaid Other

## 2021-08-08 DIAGNOSIS — Z3042 Encounter for surveillance of injectable contraceptive: Secondary | ICD-10-CM

## 2021-08-08 MED ORDER — MEDROXYPROGESTERONE ACETATE 150 MG/ML IM SUSP
150.0000 mg | Freq: Once | INTRAMUSCULAR | Status: AC
  Administered 2021-08-08: 150 mg via INTRAMUSCULAR

## 2021-08-08 NOTE — Progress Notes (Signed)
Patient here today for Depo Provera injection and is within her dates.   ?  ?Last contraceptive appt was 05/09/2021. ?  ?Depo given in RUOQ  today. Site unremarkable & patient tolerated injection.   ?  ?Next injection due 10/24/2021-11/07/2021. Reminder card given.   ?

## 2021-09-04 ENCOUNTER — Encounter: Payer: Self-pay | Admitting: *Deleted

## 2021-09-29 IMAGING — US US OB < 14 WEEKS - US OB TV
1 series · 15 of 28 positions shown · non-contrast
Comparison: None.

CLINICAL DATA: Vaginal bleeding and first-trimester pregnancy. For
weeks 4 days by LMP. Beta HCG is not known.

EXAM:
OBSTETRIC <14 WK US AND TRANSVAGINAL OB US
TECHNIQUE: Both transabdominal and transvaginal ultrasound examinations were
performed for complete evaluation of the gestation as well as the
maternal uterus, adnexal regions, and pelvic cul-de-sac.
Transvaginal technique was performed to assess early pregnancy.

[Series 1: us ob < 14 weeks - us ob tv · 15 of 37 slices shown]
[im 1/37]
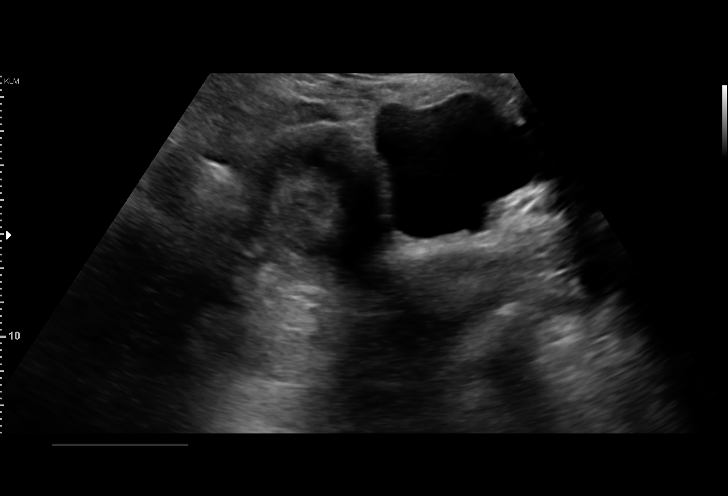
[im 3/37]
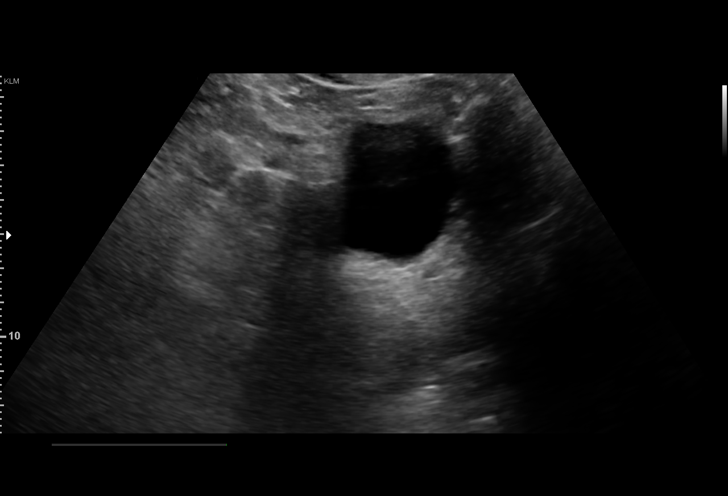
[im 6/37]
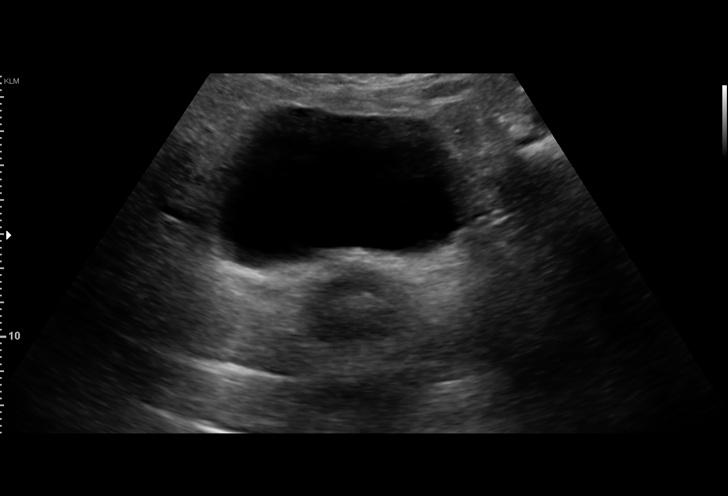
[im 9/37]
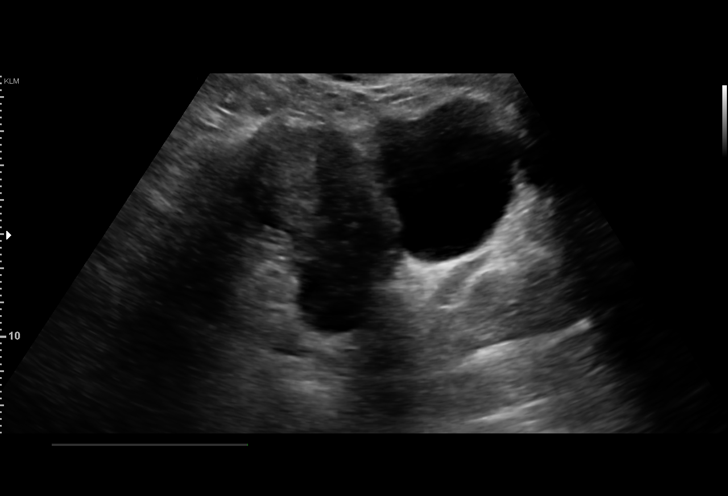
[im 11/37]
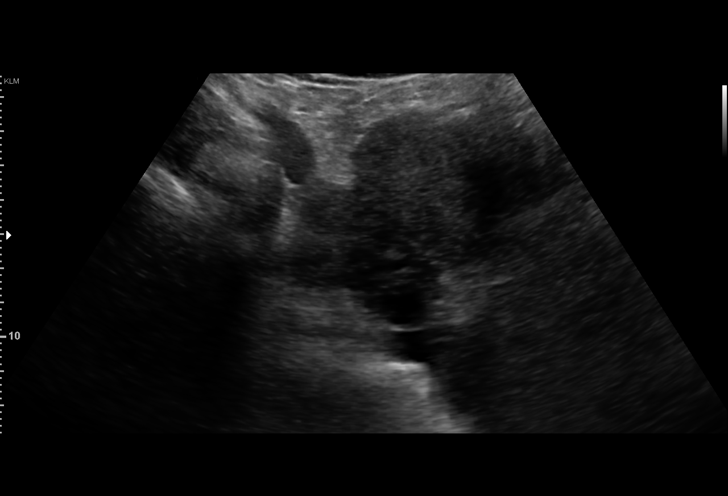
[im 14/37]
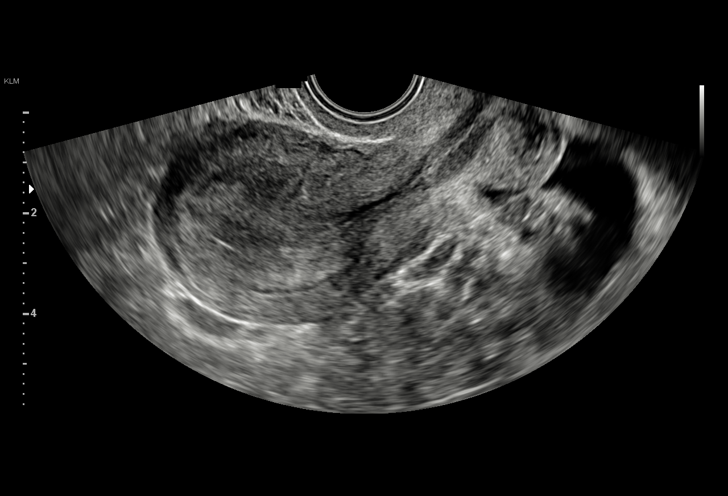
[im 17/37]
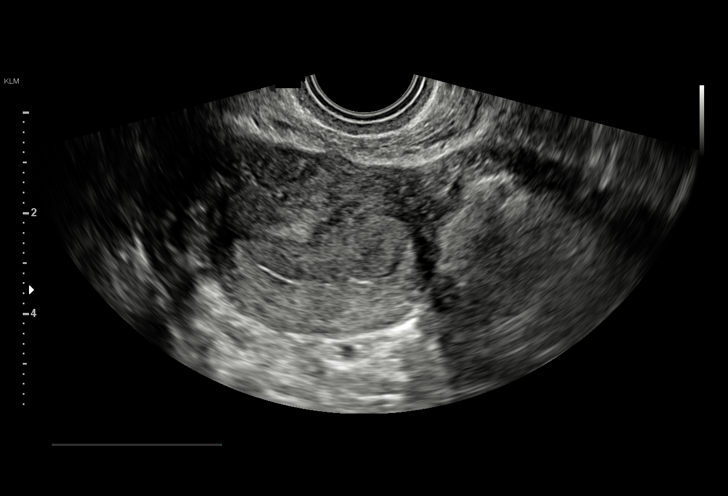
[im 19/37]
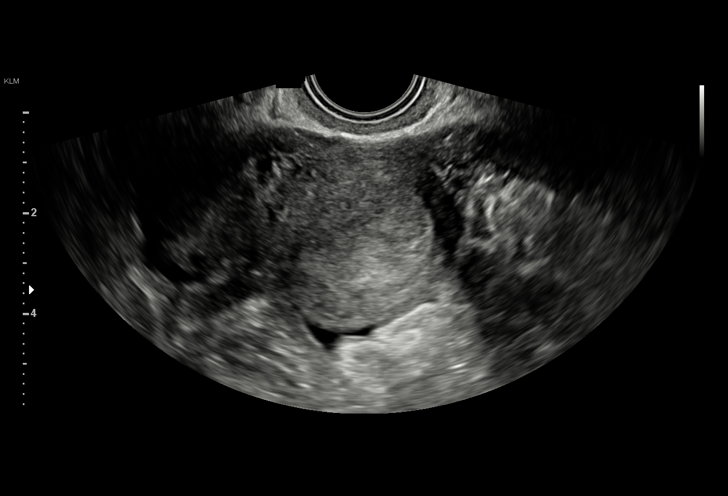
[im 21/37]
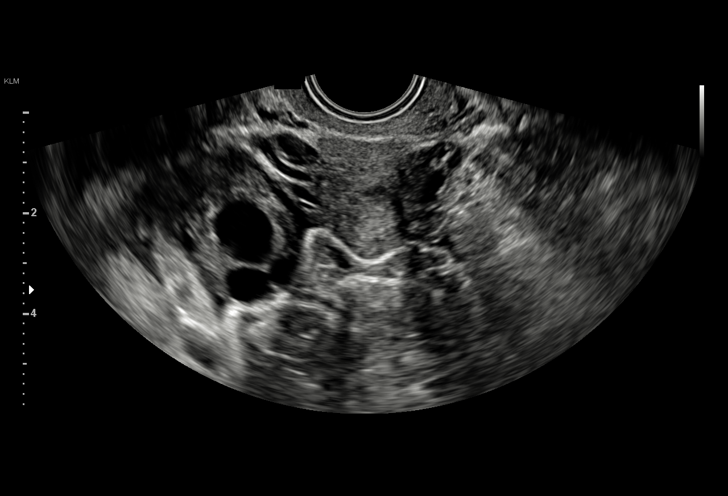
[im 23/37]
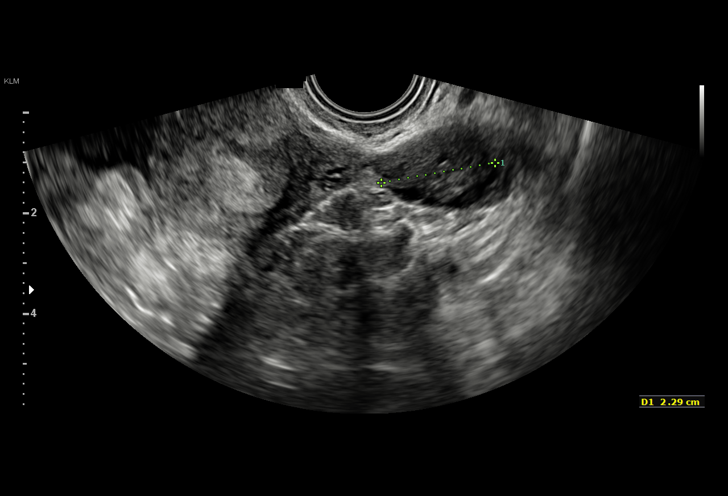
[im 26/37]
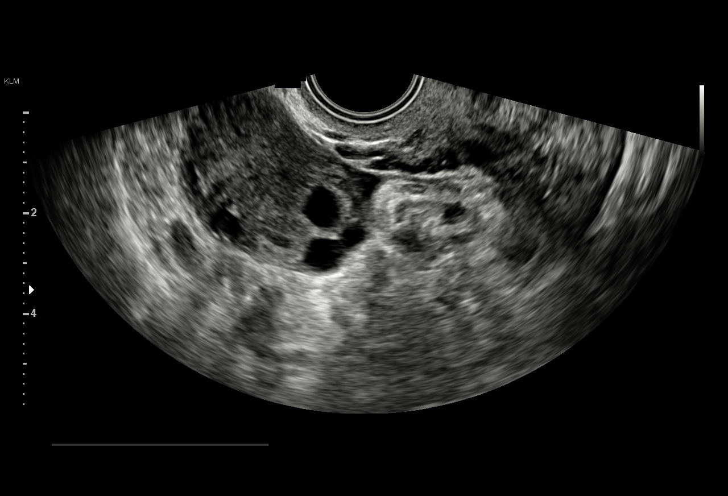
[im 29/37]
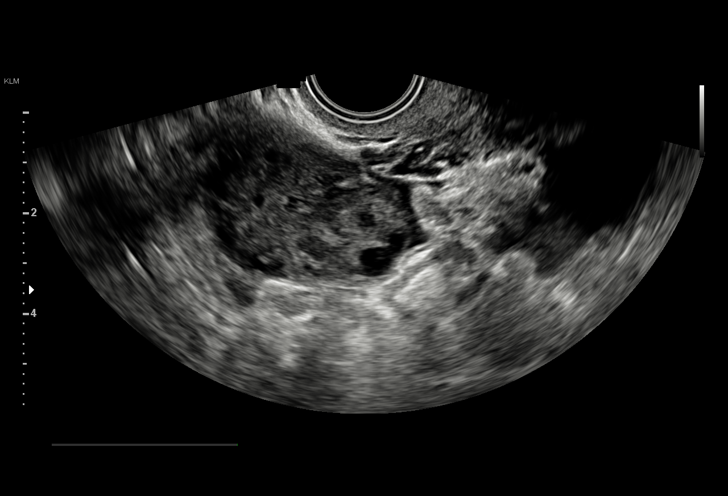
[im 31/37]
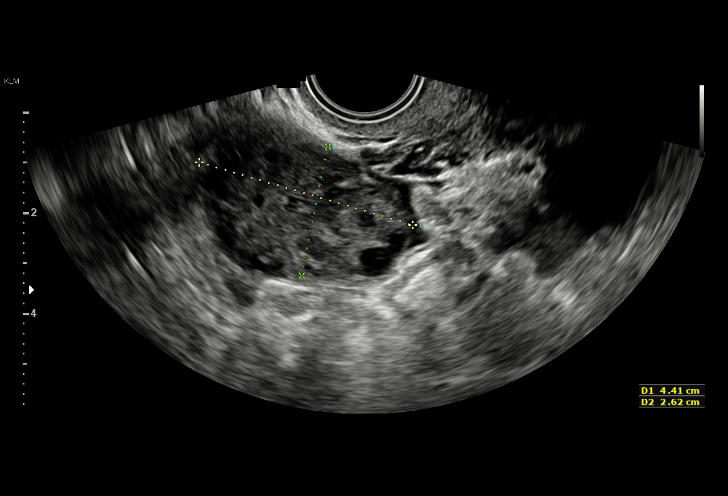
[im 34/37]
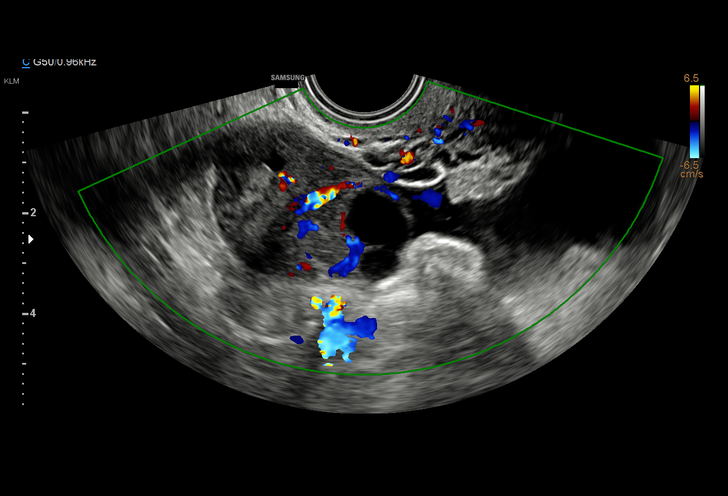
[im 37/37]
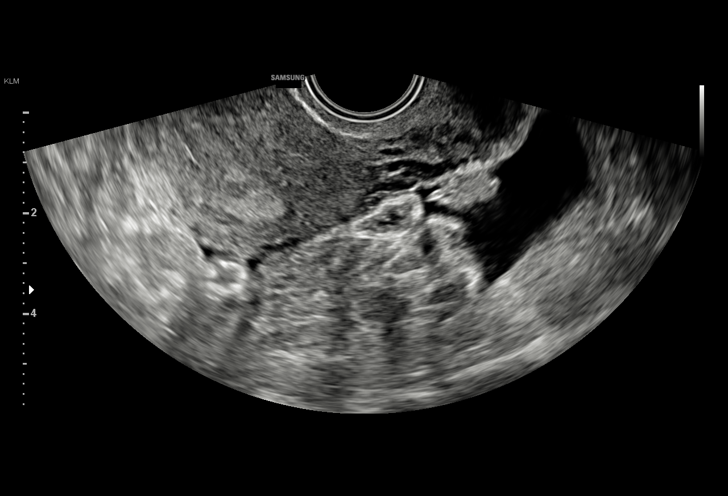

[15 of 28 positions shown; findings below may reference images not displayed]

FINDINGS: No intra or extrauterine gestational sac is seen. Right adnexal
peripheral hypervascular nodular structure is intra-ovarian and
consistent with corpus luteum. Small volume simple pelvic fluid. No
extra ovarian adnexal mass.
IMPRESSION: Pregnancy of unknown location with small volume simple pelvic fluid
and right corpus luteum. Differential considerations include
intrauterine gestation too early to be sonographically visualized,
spontaneous abortion, or ectopic pregnancy. Consider follow-up
ultrasound in 10 days and serial quantitative beta HCG follow-up.

## 2021-10-14 ENCOUNTER — Ambulatory Visit (HOSPITAL_COMMUNITY)
Admission: RE | Admit: 2021-10-14 | Discharge: 2021-10-14 | Disposition: A | Discharge status: Home / Self Care | Payer: Medicaid Other | Source: Ambulatory Visit | Attending: Family Medicine | Admitting: Family Medicine

## 2021-10-14 ENCOUNTER — Encounter (HOSPITAL_COMMUNITY): Payer: Self-pay

## 2021-10-14 VITALS — BP 129/87 | HR 79 | Temp 99.2°F | Resp 16

## 2021-10-14 DIAGNOSIS — N760 Acute vaginitis: Secondary | ICD-10-CM | POA: Insufficient documentation

## 2021-10-14 IMAGING — US US OB TRANSVAGINAL
1 series · 15 of 28 positions shown · non-contrast
Comparison: 01/18/2019 obstetric scan.

CLINICAL DATA: 18-year-old pregnant female presents for assessment
of fetal dating and viability. Quantitative beta HCG 199 on
01/22/2019. No acute symptoms reported.

EDC by LMP: 09/23/2019, projecting to an expected gestational age of
6 weeks 5 days.
EXAM:
TRANSVAGINAL OB ULTRASOUND
TECHNIQUE: Transvaginal ultrasound was performed for complete evaluation of the
gestation as well as the maternal uterus, adnexal regions, and
pelvic cul-de-sac.

[Series 1: us ob transvaginal · 15 of 39 slices shown]
[im 1/39]
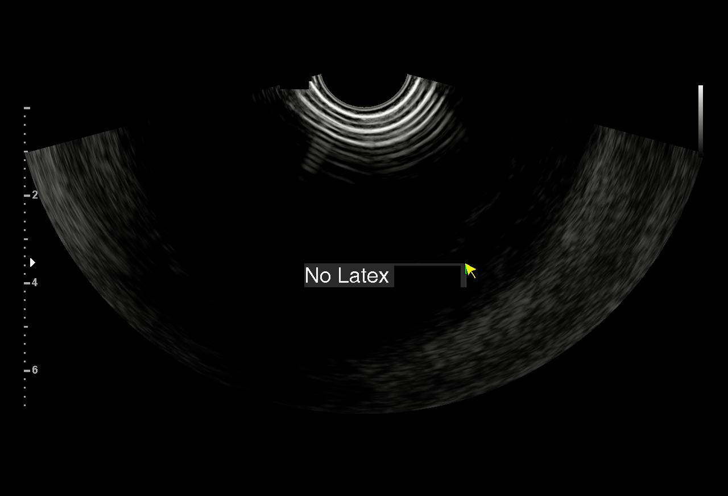
[im 3/39]
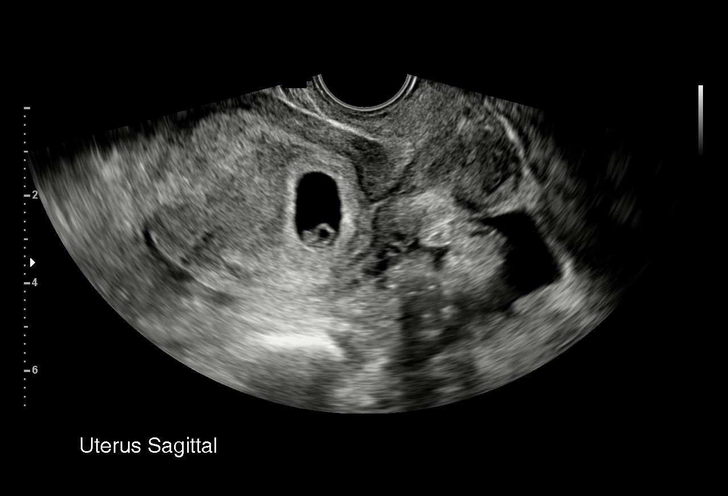
[im 6/39]
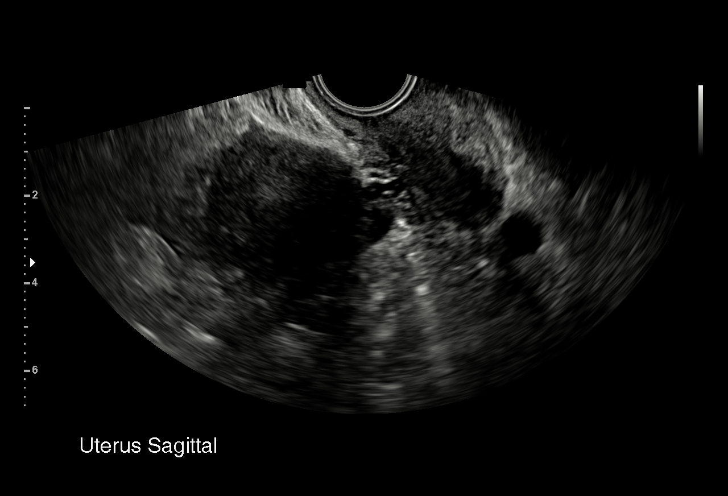
[im 9/39]
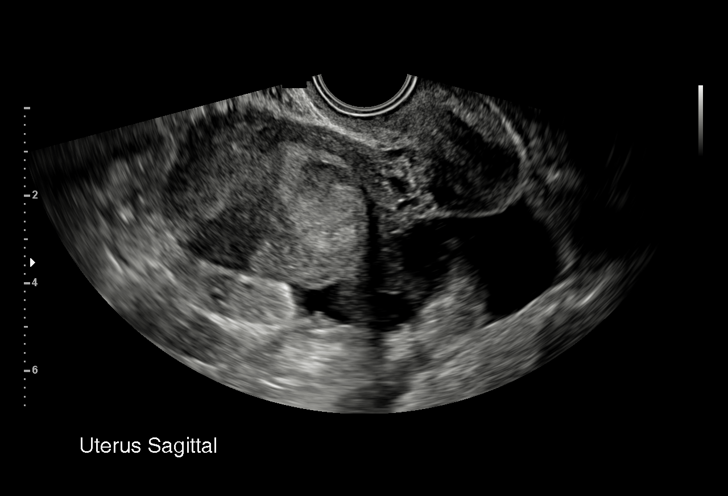
[im 12/39]
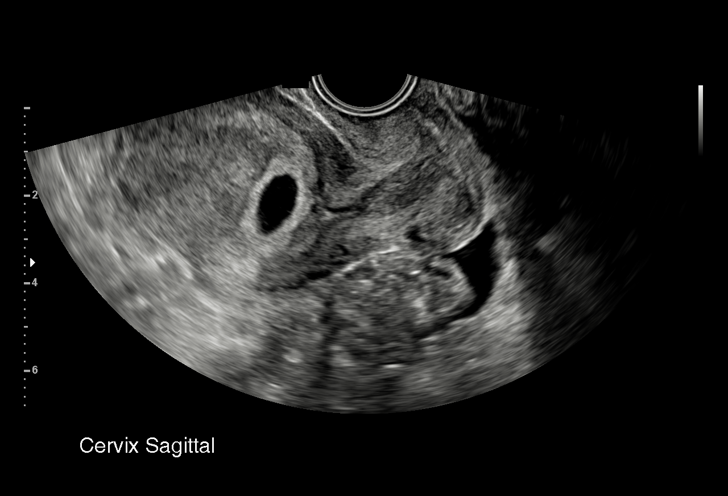
[im 15/39]
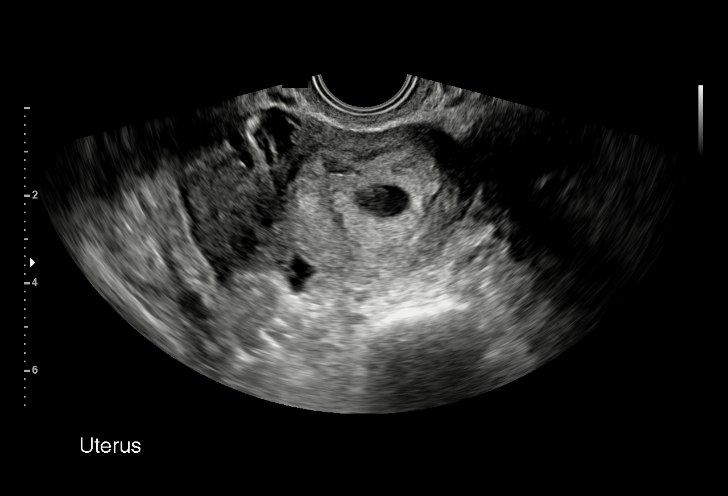
[im 17/39]
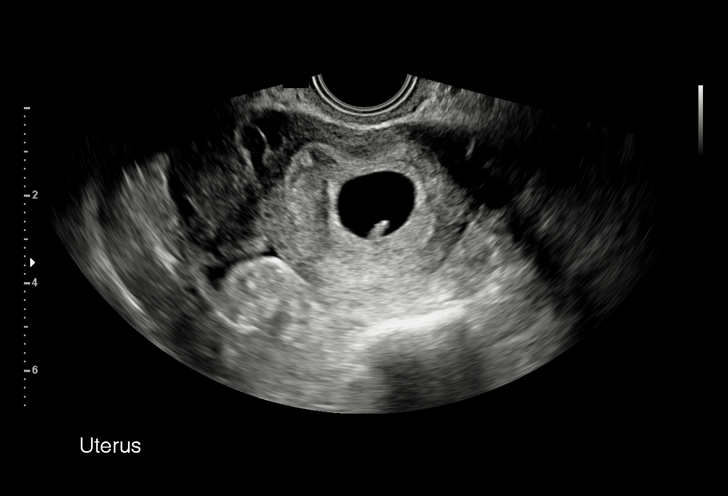
[im 20/39]
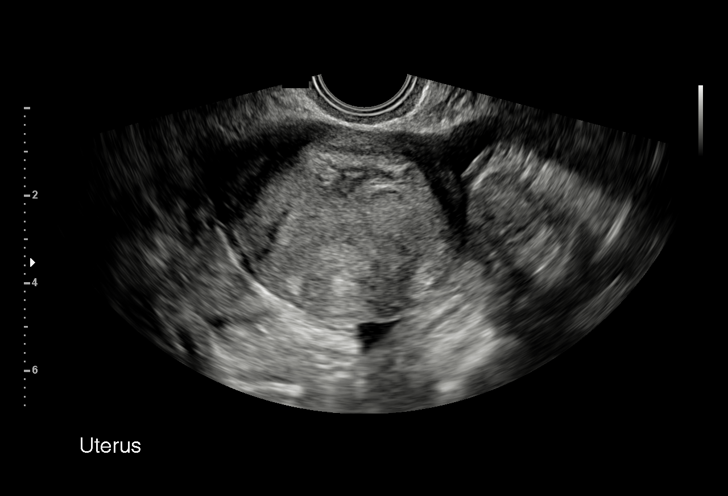
[im 22/39]
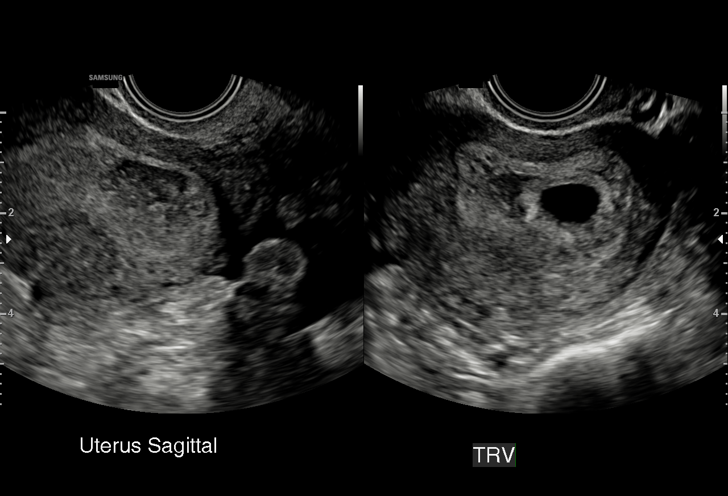
[im 24/39]
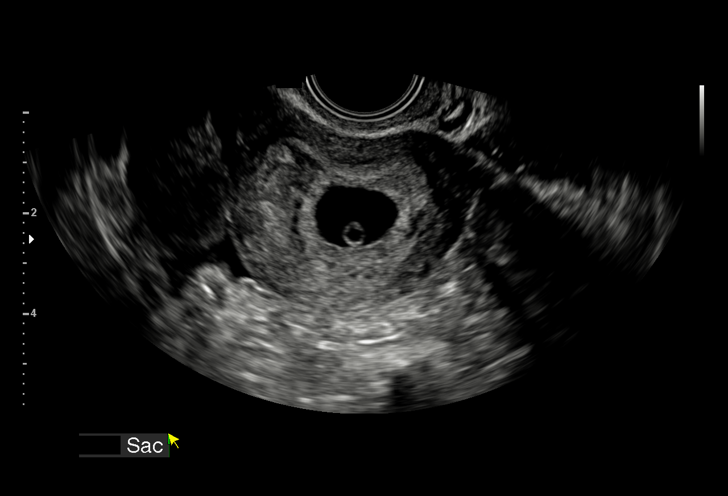
[im 27/39]
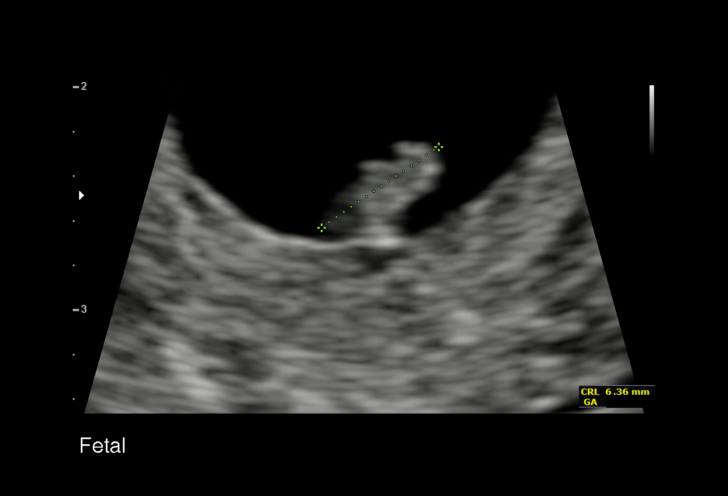
[im 30/39]
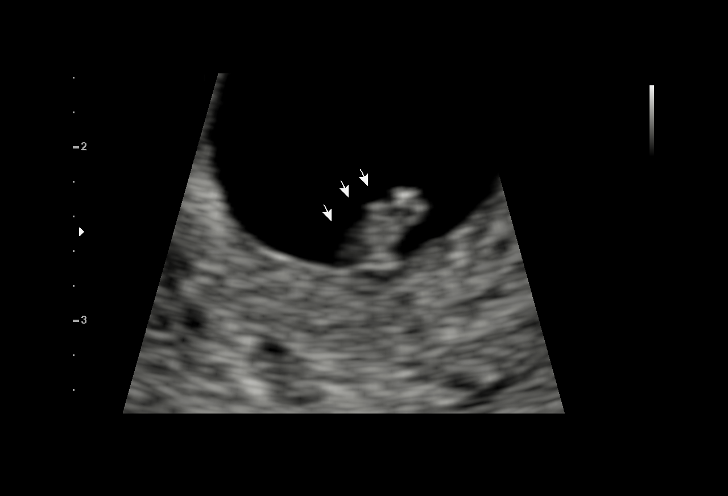
[im 33/39]
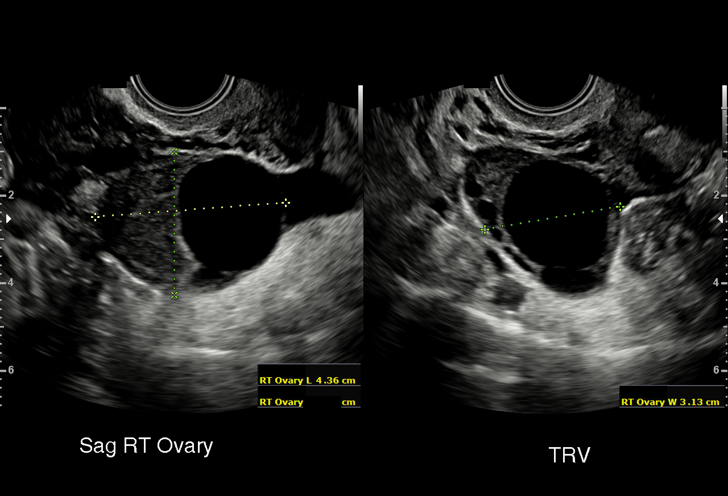
[im 36/39]
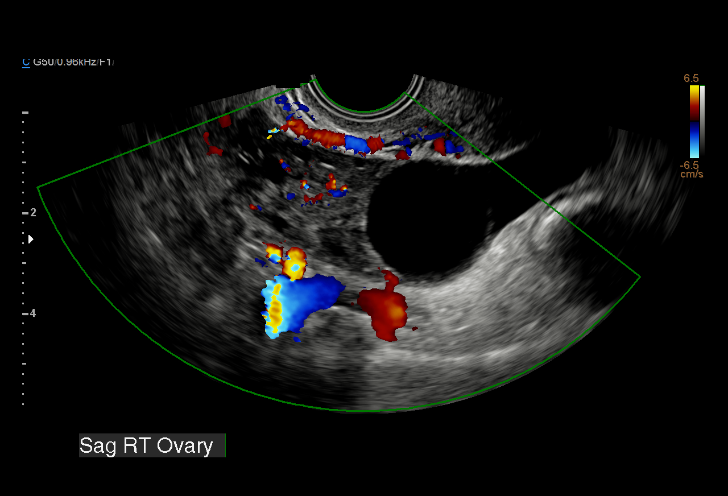
[im 39/39]
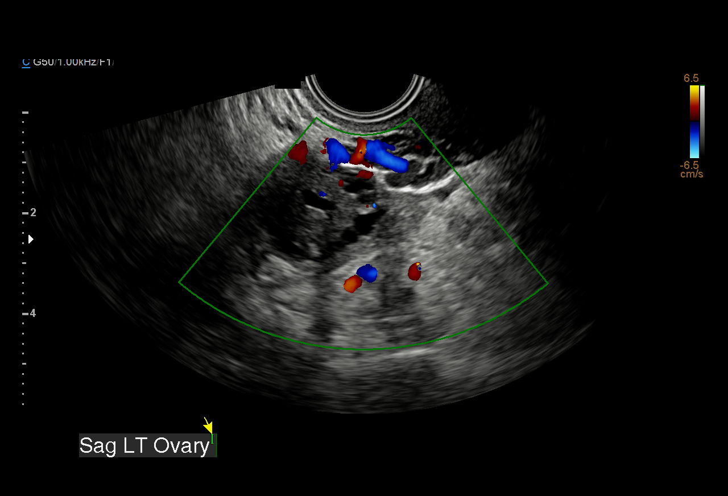

[15 of 28 positions shown; findings below may reference images not displayed]

FINDINGS: Intrauterine gestational sac: Single

Yolk sac:  Visualized.

Embryo:  Visualized.

Cardiac Activity: Visualized.

Heart Rate: 120 bpm

CRL:   6.3 mm   6 w 3 d                  US EDC: 09/25/2019

Subchorionic hemorrhage:  No convincing perigestational bleed.

Maternal uterus/adnexae: Right ovary measures 4.4 x 3.3 x 3.1 cm and
contains a simple 2.7 cm cyst compatible with corpus luteal cyst.
Left ovary measures 2.6 x 1.9 x 1.6 cm. No suspicious ovarian or
adnexal masses. Small volume free fluid in the pelvic cul-de-sac.
IMPRESSION: 1. Single living intrauterine gestation at 6 weeks 3 days by
crown-rump length, concordant with provided menstrual dating. No
acute first-trimester gestational abnormality.
2. Small volume nonspecific free fluid in the pelvic cul-de-sac. No
suspicious ovarian or adnexal findings.

## 2021-10-14 MED ORDER — FLUCONAZOLE 150 MG PO TABS: 150.0000 mg | ORAL_TABLET | Freq: Once | ORAL | 0 refills | Status: AC

## 2021-10-14 NOTE — ED Triage Notes (Signed)
Pt reports vaginal discharge x 2- 3 days.

## 2021-10-14 NOTE — Discharge Instructions (Addendum)
Staff will notify you if anything is positive on the swab.  Take fluconazole 150 mg--1 tablet once for possible yeast infection

## 2021-10-14 NOTE — ED Provider Notes (Signed)
[facility]    CSN: [id number] Arrival date & time: 10/14/21  1427      History   Chief Complaint Chief Complaint  Patient presents with   Vaginal Discharge    Entered by patient    HPI Ann Boyd is a 21 y.o. female.    Vaginal Discharge  Here for vaginal discharge that has increased in the last 2 days, with some odor.  She has had a history of yeast infection in the past, and requests empiric treatment for that while we wait on the results of the swab.  She has not had a period in a long time as she is on Depo-Provera.  Last Depo injection was May 10, on chart review.  Past Medical History:  Diagnosis Date   Hx of chlamydia infection 09/2018   Hx of gonorrhea 09/2018   Hx of trichomoniasis 09/2017   Medical history non-contributory     Patient Active Problem List   Diagnosis Date Noted   Contraceptive surveillance 06/27/2020   History of COVID-19 05/01/2020   History of gestational hypertension 05/01/2020   COVID-19 affecting pregnancy in third trimester 04/04/2020    Past Surgical History:  Procedure Laterality Date   NO PAST SURGERIES      OB History     Gravida  2   Para  1   Term  1   Preterm      AB  1   Living  1      SAB      IAB      Ectopic      Multiple  0   Live Births  1            Home Medications    Prior to Admission medications   Medication Sig Start Date End Date Taking? Authorizing Provider  fluconazole (DIFLUCAN) 150 MG tablet Take 1 tablet (150 mg total) by mouth once for 1 dose. 10/14/21 10/14/21 Yes Zenia Resides, MD  ferrous gluconate (FERGON) 324 MG tablet Take 1 tablet (324 mg total) by mouth daily with breakfast. Patient not taking: Reported on 05/01/2020 09/13/19 05/09/20  Sharyon Cable, CNM  medroxyPROGESTERone (DEPO-PROVERA) 150 MG/ML injection Inject 1 mL (150 mg total) into the muscle every 3 (three) months. 11/26/12 11/30/18  Dessa Phi, MD    Family History Family  History  Problem Relation Age of Onset   Healthy Mother    Hypertension Mother    Healthy Father    ADD / ADHD Brother     Social History Social History   Tobacco Use   Smoking status: Never   Smokeless tobacco: Never  Vaping Use   Vaping Use: Never used  Substance Use Topics   Alcohol use: No   Drug use: No     Allergies   Patient has no known allergies.   Review of Systems Review of Systems  Genitourinary:  Positive for vaginal discharge.     Physical Exam Triage Vital Signs ED Triage Vitals [10/14/21 1453]  Enc Vitals Group     BP 129/87     Pulse Rate 79     Resp 16     Temp 99.2 F (37.3 C)     Temp Source Oral     SpO2 96 %     Weight      Height      Head Circumference      Peak Flow      Pain Score  Pain Loc      Pain Edu?      Excl. in GC?    No data found.  Updated Vital Signs BP 129/87 (BP Location: Left Arm)   Pulse 79   Temp 99.2 F (37.3 C) (Oral)   Resp 16   SpO2 96%   Visual Acuity Right Eye Distance:   Left Eye Distance:   Bilateral Distance:    Right Eye Near:   Left Eye Near:    Bilateral Near:     Physical Exam Vitals reviewed.  Constitutional:      General: She is not in acute distress.    Appearance: She is not ill-appearing, toxic-appearing or diaphoretic.  Cardiovascular:     Rate and Rhythm: Normal rate and regular rhythm.     Heart sounds: No murmur heard. Pulmonary:     Effort: Pulmonary effort is normal.     Breath sounds: Normal breath sounds.  Abdominal:     Palpations: Abdomen is soft.     Tenderness: There is no abdominal tenderness.  Skin:    Coloration: Skin is not pale.  Neurological:     Mental Status: She is alert and oriented to person, place, and time.  Psychiatric:        Behavior: Behavior normal.      UC Treatments / Results  Labs (all labs ordered are listed, but only abnormal results are displayed) Labs Reviewed  CERVICOVAGINAL ANCILLARY ONLY    EKG   Radiology No  results found.  Procedures Procedures (including critical care time)  Medications Ordered in UC Medications - No data to display  Initial Impression / Assessment and Plan / UC Course  I have reviewed the triage vital signs and the nursing notes.  Pertinent labs & imaging results that were available during my care of the patient were reviewed by me and considered in my medical decision making (see chart for details).     Fluconazole sent for possible yeast vaginitis.  Staff will notify her of any positives and treat per protocol Final Clinical Impressions(s) / UC Diagnoses   Final diagnoses:  Acute vaginitis     Discharge Instructions      Staff will notify you if anything is positive on the swab.  Take fluconazole 150 mg--1 tablet once for possible yeast infection     ED Prescriptions     Medication Sig Dispense Auth. Provider   fluconazole (DIFLUCAN) 150 MG tablet Take 1 tablet (150 mg total) by mouth once for 1 dose. 1 tablet Marlinda Mike Janace Aris, MD      PDMP not reviewed this encounter.   Zenia Resides, MD 10/14/21 [phone]

## 2021-10-15 LAB — CERVICOVAGINAL ANCILLARY ONLY
Bacterial Vaginitis (gardnerella): POSITIVE — AB
Candida Glabrata: NEGATIVE
Candida Vaginitis: POSITIVE — AB
Chlamydia: NEGATIVE
Comment: NEGATIVE
Comment: NEGATIVE
Comment: NEGATIVE
Comment: NEGATIVE
Comment: NEGATIVE
Comment: NORMAL
Neisseria Gonorrhea: NEGATIVE
Trichomonas: NEGATIVE

## 2021-10-16 ENCOUNTER — Telehealth (HOSPITAL_COMMUNITY): Payer: Self-pay | Admitting: Emergency Medicine

## 2021-10-16 MED ORDER — METRONIDAZOLE 0.75 % VA GEL: 1.0000 | Freq: Every day | VAGINAL | 0 refills | Status: AC

## 2021-10-31 ENCOUNTER — Ambulatory Visit (INDEPENDENT_AMBULATORY_CARE_PROVIDER_SITE_OTHER): Payer: Medicaid Other

## 2021-10-31 DIAGNOSIS — Z3049 Encounter for surveillance of other contraceptives: Secondary | ICD-10-CM | POA: Diagnosis not present

## 2021-10-31 MED ORDER — MEDROXYPROGESTERONE ACETATE 150 MG/ML IM SUSP
150.0000 mg | Freq: Once | INTRAMUSCULAR | Status: AC
  Administered 2021-10-31: 150 mg via INTRAMUSCULAR

## 2021-10-31 NOTE — Progress Notes (Signed)
Patient here today for Depo Provera injection and is within her dates.    Last contraceptive appt was 05/09/2021  Depo given in LUOQ today. Site unremarkable & patient tolerated injection.    Next injection due 01/16/2022-01/30/2022.  Reminder card given.

## 2021-11-20 ENCOUNTER — Other Ambulatory Visit (HOSPITAL_COMMUNITY)
Admission: RE | Admit: 2021-11-20 | Discharge: 2021-11-20 | Disposition: A | Discharge status: Home / Self Care | Payer: Medicaid Other | Source: Ambulatory Visit | Attending: Family Medicine | Admitting: Family Medicine

## 2021-11-20 ENCOUNTER — Ambulatory Visit (INDEPENDENT_AMBULATORY_CARE_PROVIDER_SITE_OTHER): Payer: Medicaid Other | Admitting: Student

## 2021-11-20 VITALS — BP 118/82 | HR 91 | Ht 65.0 in | Wt 179.2 lb

## 2021-11-20 DIAGNOSIS — Z113 Encounter for screening for infections with a predominantly sexual mode of transmission: Secondary | ICD-10-CM | POA: Insufficient documentation

## 2021-11-20 LAB — POCT WET PREP (WET MOUNT)
Clue Cells Wet Prep Whiff POC: NEGATIVE
Trichomonas Wet Prep HPF POC: ABSENT
WBC, Wet Prep HPF POC: >20

## 2021-11-20 NOTE — Progress Notes (Unsigned)
.  et

## 2021-11-20 NOTE — Patient Instructions (Signed)
It was great to see you! Thank you for allowing me to participate in your care!   We are checking some labs today, I will call you if they are abnormal will send you a MyChart message or a letter if they are normal.  If you do not hear about your labs in the next 2 weeks please let us know.  Take care and seek immediate care sooner if you develop any concerns.   Dr. Saarah Dewing, DO [facility]  

## 2021-11-21 DIAGNOSIS — Z113 Encounter for screening for infections with a predominantly sexual mode of transmission: Secondary | ICD-10-CM | POA: Insufficient documentation

## 2021-11-21 LAB — RPR: RPR Ser Ql: NONREACTIVE

## 2021-11-21 LAB — CERVICOVAGINAL ANCILLARY ONLY
Chlamydia: NEGATIVE
Comment: NEGATIVE
Comment: NORMAL
Neisseria Gonorrhea: NEGATIVE

## 2021-11-21 LAB — HIV ANTIBODY (ROUTINE TESTING W REFLEX): HIV Screen 4th Generation wRfx: NONREACTIVE

## 2021-11-21 NOTE — Progress Notes (Signed)
    SUBJECTIVE:   CHIEF COMPLAINT / HPI:   STD screening Patient presents today requesting STD screening.  She has a new partner and has had unprotected sex.  She states she had a small amount of brown discharge yesterday and would like to be evaluated for STDs.  States she has not had a period since she has been on the Depo shot.  She denies any pelvic or lower abdominal pain denies any nausea, fever.  PERTINENT  PMH / PSH: Hx of STDs  OBJECTIVE:   BP 118/82   Pulse 91   Ht 5\' 5"  (1.651 m)   Wt 179 lb 3.2 oz (81.3 kg)   SpO2 98%   BMI 29.82 kg/m    General: NAD, pleasant, able to participate in exam Cardiac: Well perfused Respiratory: Breathing comfortably on room air Extremities: no edema of BLEs GU: Normal-appearing female external genitalia with no lesions or warts, moist pink vaginal mucosa, cervix with with ectropion but otherwise normal, no discharge noted Skin: warm and dry, no rashes noted Neuro: alert, no obvious focal deficits Psych: Normal affect and mood  ASSESSMENT/PLAN:   Screen for STD (sexually transmitted disease) We will screen for STDs.  Wet prep was also obtained and was negative.  Is possible scant brown discharge associated with breakthrough vaginal bleeding while on depo. -HIV -RPR -Swabs for gonorrhea and chlamydia     Dr. , DO [facility]

## 2021-11-21 NOTE — Assessment & Plan Note (Addendum)
We will screen for STDs.  Wet prep was also obtained and was negative.  Is possible scant brown discharge associated with breakthrough vaginal bleeding while on depo. -HIV -RPR -Swabs for gonorrhea and chlamydia

## 2022-01-21 ENCOUNTER — Encounter: Payer: Self-pay | Admitting: Family Medicine

## 2022-01-21 ENCOUNTER — Other Ambulatory Visit: Payer: Self-pay

## 2022-01-21 ENCOUNTER — Ambulatory Visit (INDEPENDENT_AMBULATORY_CARE_PROVIDER_SITE_OTHER): Payer: Medicaid Other | Admitting: Family Medicine

## 2022-01-21 VITALS — HR 72 | Ht 65.0 in | Wt 182.0 lb

## 2022-01-21 DIAGNOSIS — Z23 Encounter for immunization: Secondary | ICD-10-CM

## 2022-01-21 DIAGNOSIS — Z3042 Encounter for surveillance of injectable contraceptive: Secondary | ICD-10-CM

## 2022-01-21 DIAGNOSIS — Z111 Encounter for screening for respiratory tuberculosis: Secondary | ICD-10-CM | POA: Diagnosis not present

## 2022-01-21 DIAGNOSIS — Z309 Encounter for contraceptive management, unspecified: Secondary | ICD-10-CM

## 2022-01-21 MED ORDER — MEDROXYPROGESTERONE ACETATE 150 MG/ML IM SUSY
150.0000 mg | PREFILLED_SYRINGE | Freq: Once | INTRAMUSCULAR | Status: AC
  Administered 2022-01-21: 150 mg via INTRAMUSCULAR

## 2022-01-21 NOTE — Assessment & Plan Note (Signed)
PPD given today. Will f/u in 48-72hrs for read. Screening needed for school.

## 2022-01-21 NOTE — Assessment & Plan Note (Signed)
Received Depo today. Pt is bothered by breakthrough bleeding, as it causes her some anxiety that it could be an infection such as BV, yeast, or STI. We discussed other options but she is not interested in OCP, IUD, or nexplannon at this time.  - Continue Depo, dose given today - Advised pt that we can continue discussing other contraceptive options in the future if breakthrough bleeding is too bothersome. Of note, pt does desire future fertility but does not want any in the near future (within the next yr)

## 2022-01-21 NOTE — Patient Instructions (Signed)
Good to see you today - Thank you for coming in  Things we discussed today:  1) Birth Control - For now, let's continue using Depo for birth control. Let's keep an eye on the breakthrough bleeding - Other options include birth control pills, IUD's, and nexplanon. We can continue discussing these options in the future. - Signs that the discharge may be due to something more serious than just breakthrough bleeding:  - Abnormal vaginal discharge, particularly if it is thick and puss-like  - Pain with urination  - Pain with sex  2) We will do a TB skin test. Come back in 48-72 hrs to get it read.   Please always bring your medication bottles  Come back in 3 months for your next Depo shot.

## 2022-01-21 NOTE — Progress Notes (Signed)
    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 Pt needs TB test for school. Pt reports breakthrough bleeding from Depo. She has been on depo since Jan 2022. She notes that she has had breakthrough for a while, but recently is more frequent. She notices that sometimes sex will trigger bleeding, but it also occurs without sex. Pt is especially worried that the breakthrough bleeding causes her to worry about another cause such as potential infection. She reports being sexually active with one female partner and presumes that they are both monogamous.   PERTINENT  PMH / PSH: hx of covid, gestational HTN  OBJECTIVE:   Pulse 72   Ht 5\' 5"  (1.651 m)   Wt 182 lb (82.6 kg)   SpO2 100%   BMI 30.29 kg/m   Gen: Well-appearing young woman. NAD HEENT: NCAT, PERRLA. MMM. Oropharynx clear. BL TM pearly. No LAD. Resp: CTAB. No wheezing or crackles. Normal WOB on RA. CV: RRR Abm: Soft, nontender, nondistended. Normal BS.  ASSESSMENT/PLAN:   Contraceptive surveillance Received Depo today. Pt is bothered by breakthrough bleeding, as it causes her some anxiety that it could be an infection such as BV, yeast, or STI. We discussed other options but she is not interested in OCP, IUD, or nexplannon at this time.  - Continue Depo, dose given today - Advised pt that we can continue discussing other contraceptive options in the future if breakthrough bleeding is too bothersome. Of note, pt does desire future fertility but does not want any in the near future (within the next yr)  Visit for TB skin test PPD given today. Will f/u in 48-72hrs for read. Screening needed for school.   Arlyce Dice, MD [place]

## 2022-01-24 ENCOUNTER — Ambulatory Visit: Payer: Medicaid Other

## 2022-01-24 DIAGNOSIS — Z111 Encounter for screening for respiratory tuberculosis: Secondary | ICD-10-CM

## 2022-01-24 LAB — TB SKIN TEST
Induration: 0 mm
TB Skin Test: NEGATIVE

## 2022-01-24 NOTE — Progress Notes (Signed)
PPD Reading Note PPD read and results entered in [facility]. Result: 0 mm induration. Interpretation: negative Allergic reaction: no  Patient needs 2 step PPD for school.   PPD step 2 scheduled for 11/6.

## 2022-02-04 ENCOUNTER — Ambulatory Visit: Payer: Medicaid Other

## 2022-03-11 ENCOUNTER — Ambulatory Visit (HOSPITAL_COMMUNITY)
Admission: RE | Admit: 2022-03-11 | Discharge: 2022-03-11 | Disposition: A | Discharge status: Home / Self Care | Payer: Medicaid Other | Source: Ambulatory Visit | Attending: Emergency Medicine | Admitting: Emergency Medicine

## 2022-03-11 ENCOUNTER — Encounter (HOSPITAL_COMMUNITY): Payer: Self-pay

## 2022-03-11 VITALS — BP 138/93 | HR 85 | Temp 98.3°F | Resp 18

## 2022-03-11 DIAGNOSIS — Z113 Encounter for screening for infections with a predominantly sexual mode of transmission: Secondary | ICD-10-CM | POA: Insufficient documentation

## 2022-03-11 DIAGNOSIS — R109 Unspecified abdominal pain: Secondary | ICD-10-CM | POA: Diagnosis not present

## 2022-03-11 DIAGNOSIS — N898 Other specified noninflammatory disorders of vagina: Secondary | ICD-10-CM | POA: Diagnosis not present

## 2022-03-11 DIAGNOSIS — J069 Acute upper respiratory infection, unspecified: Secondary | ICD-10-CM | POA: Diagnosis not present

## 2022-03-11 LAB — POCT URINALYSIS DIPSTICK, ED / UC
Bilirubin Urine: NEGATIVE
Glucose, UA: NEGATIVE mg/dL
Hgb urine dipstick: NEGATIVE
Ketones, ur: NEGATIVE mg/dL
Nitrite: NEGATIVE
Protein, ur: 30 mg/dL — AB
Specific Gravity, Urine: >=1.03 (ref 1.005–1.030)
Urobilinogen, UA: 0.2 mg/dL (ref 0.0–1.0)
pH: 6 (ref 5.0–8.0)

## 2022-03-11 LAB — POC URINE PREG, ED: Preg Test, Ur: NEGATIVE

## 2022-03-11 MED ORDER — GUAIFENESIN ER 600 MG PO TB12: 600.0000 mg | ORAL_TABLET | Freq: Two times a day (BID) | ORAL | 0 refills | Status: AC

## 2022-03-11 NOTE — ED Triage Notes (Signed)
Pt c/o vaginal discharge with an odor and lower abdominal cramping x3 days.   Pt c/o cough, congestion, and headaches since yesterday. Taking OTC with no relief.

## 2022-03-11 NOTE — Discharge Instructions (Addendum)
I recommend mucinex with lots of fluids. You can also try nasal spray. Try to blow your nose as much as possible instead of sniffing.  We will call you if anything on your swab returns positive. Please abstain from sexual intercourse until your results return.

## 2022-03-11 NOTE — ED Provider Notes (Signed)
[facility]    CSN: [id number] Arrival date & time: 03/11/22  1356      History   Chief Complaint Chief Complaint  Patient presents with   Vaginal Discharge   Cough    HPI Ann Boyd is a 21 y.o. female.  Presents with 3 day history of lower abdominal cramping Reports vaginal discharge, malodorous No known exposure to STD. History of BV infections. Denies urinary symptoms.  LMP unknown, gets injections  Additionally nasal congestion since yesterday Sinus pressure and headache Tried OTC sinus medicine and one mucinex No fevers, cough, or sore throat No known sick contacts  Past Medical History:  Diagnosis Date   Hx of chlamydia infection 09/2018   Hx of gonorrhea 09/2018   Hx of trichomoniasis 09/2017   Medical history non-contributory     Patient Active Problem List   Diagnosis Date Noted   Visit for TB skin test 01/21/2022   Screen for STD (sexually transmitted disease) 11/21/2021   Contraceptive surveillance 06/27/2020   History of COVID-19 05/01/2020   History of gestational hypertension 05/01/2020   COVID-19 affecting pregnancy in third trimester 04/04/2020    Past Surgical History:  Procedure Laterality Date   NO PAST SURGERIES      OB History     Gravida  2   Para  1   Term  1   Preterm      AB  1   Living  1      SAB      IAB      Ectopic      Multiple  0   Live Births  1            Home Medications    Prior to Admission medications   Medication Sig Start Date End Date Taking? Authorizing Provider  guaiFENesin (MUCINEX) 600 MG 12 hr tablet Take 1 tablet (600 mg total) by mouth 2 (two) times daily for 5 days. 03/11/22 03/16/22 Yes Lania Zawistowski, Lurena Joiner, PA-C  ferrous gluconate (FERGON) 324 MG tablet Take 1 tablet (324 mg total) by mouth daily with breakfast. Patient not taking: Reported on 05/01/2020 09/13/19 05/09/20  Sharyon Cable, CNM  medroxyPROGESTERone (DEPO-PROVERA) 150 MG/ML injection Inject 1 mL  (150 mg total) into the muscle every 3 (three) months. 11/26/12 11/30/18  Dessa Phi, MD    Family History Family History  Problem Relation Age of Onset   Healthy Mother    Hypertension Mother    Healthy Father    ADD / ADHD Brother     Social History Social History   Tobacco Use   Smoking status: Never   Smokeless tobacco: Never  Vaping Use   Vaping Use: Never used  Substance Use Topics   Alcohol use: No   Drug use: No     Allergies   Patient has no known allergies.   Review of Systems Review of Systems Per HPI  Physical Exam Triage Vital Signs ED Triage Vitals [03/11/22 1426]  Enc Vitals Group     BP (!) 138/93     Pulse Rate 85     Resp 18     Temp 98.3 F (36.8 C)     Temp Source Oral     SpO2 97 %     Weight      Height      Head Circumference      Peak Flow      Pain Score 4     Pain Loc  Pain Edu?      Excl. in GC?    No data found.  Updated Vital Signs BP (!) 138/93 (BP Location: Left Arm)   Pulse 85   Temp 98.3 F (36.8 C) (Oral)   Resp 18   SpO2 97%   Breastfeeding No   Physical Exam Vitals and nursing note reviewed.  Constitutional:      General: She is not in acute distress. HENT:     Nose: Congestion and rhinorrhea present.     Mouth/Throat:     Mouth: Mucous membranes are moist.     Pharynx: Uvula midline. No posterior oropharyngeal erythema.     Tonsils: No tonsillar exudate or tonsillar abscesses.  Eyes:     Conjunctiva/sclera: Conjunctivae normal.     Pupils: Pupils are equal, round, and reactive to light.  Cardiovascular:     Rate and Rhythm: Normal rate and regular rhythm.     Heart sounds: Normal heart sounds.  Pulmonary:     Effort: Pulmonary effort is normal. No respiratory distress.     Breath sounds: Normal breath sounds. No wheezing.  Abdominal:     Palpations: Abdomen is soft.     Tenderness: There is no abdominal tenderness. There is no guarding.  Musculoskeletal:     Cervical back: Normal range  of motion.  Lymphadenopathy:     Cervical: No cervical adenopathy.  Neurological:     Mental Status: She is alert and oriented to person, place, and time.     UC Treatments / Results  Labs (all labs ordered are listed, but only abnormal results are displayed) Labs Reviewed  POCT URINALYSIS DIPSTICK, ED / UC - Abnormal; Notable for the following components:      Result Value   Protein, ur 30 (*)    Leukocytes,Ua SMALL (*)    All other components within normal limits  POC URINE PREG, ED  CERVICOVAGINAL ANCILLARY ONLY    EKG  Radiology No results found.  Procedures Procedures   Medications Ordered in UC Medications - No data to display  Initial Impression / Assessment and Plan / UC Course  I have reviewed the triage vital signs and the nursing notes.  Pertinent labs & imaging results that were available during my care of the patient were reviewed by me and considered in my medical decision making (see chart for details).  Abdominal cramping and vaginal discharge  Cyto swab pending. UA small leuks, elevated specific gravity. UPT negative. Abdominal cramping may be related to discharge/STD etiology. No red flags on exam.  Viral URI Recommend symptomatic care Patient declines testing today Mucinex BID, nasal spray, fluids, etc Discussed she may develop more symptoms over the next few days. Should improve over the week. Return precautions discussed. Patient agrees to plan  Final Clinical Impressions(s) / UC Diagnoses   Final diagnoses:  Viral upper respiratory tract infection  Vaginal discharge  Screen for STD (sexually transmitted disease)  Abdominal cramping     Discharge Instructions      I recommend mucinex with lots of fluids. You can also try nasal spray. Try to blow your nose as much as possible instead of sniffing.  We will call you if anything on your swab returns positive. Please abstain from sexual intercourse until your results  return.     ED Prescriptions     Medication Sig Dispense Auth. Provider   guaiFENesin (MUCINEX) 600 MG 12 hr tablet Take 1 tablet (600 mg total) by mouth 2 (two) times daily for  5 days. 10 tablet Markelle Najarian, Lurena Joiner, PA-C      PDMP not reviewed this encounter.   Terah Robey, Ray [place] 03/11/22 1031

## 2022-03-12 ENCOUNTER — Telehealth (HOSPITAL_COMMUNITY): Payer: Self-pay | Admitting: Emergency Medicine

## 2022-03-12 LAB — CERVICOVAGINAL ANCILLARY ONLY
Bacterial Vaginitis (gardnerella): POSITIVE — AB
Candida Glabrata: NEGATIVE
Candida Vaginitis: POSITIVE — AB
Chlamydia: NEGATIVE
Comment: NEGATIVE
Comment: NEGATIVE
Comment: NEGATIVE
Comment: NEGATIVE
Comment: NEGATIVE
Comment: NORMAL
Neisseria Gonorrhea: NEGATIVE
Trichomonas: NEGATIVE

## 2022-03-12 MED ORDER — FLUCONAZOLE 150 MG PO TABS: 150.0000 mg | ORAL_TABLET | Freq: Once | ORAL | 0 refills | Status: AC

## 2022-03-12 MED ORDER — METRONIDAZOLE 500 MG PO TABS: 500.0000 mg | ORAL_TABLET | Freq: Two times a day (BID) | ORAL | 0 refills | Status: DC

## 2022-04-10 ENCOUNTER — Ambulatory Visit (INDEPENDENT_AMBULATORY_CARE_PROVIDER_SITE_OTHER): Payer: Medicaid Other

## 2022-04-10 DIAGNOSIS — Z3042 Encounter for surveillance of injectable contraceptive: Secondary | ICD-10-CM

## 2022-04-10 MED ORDER — MEDROXYPROGESTERONE ACETATE 150 MG/ML IM SUSP
150.0000 mg | Freq: Once | INTRAMUSCULAR | Status: AC
  Administered 2022-04-10: 150 mg via INTRAMUSCULAR

## 2022-04-10 NOTE — Progress Notes (Signed)
Patient here today for Depo Provera injection and is within her dates.    Last contraceptive appt was 01/21/22  Depo given in [place] today.  Site unremarkable & patient tolerated injection.    Next injection due 06/27/22-07/11/22.  Reminder card given.    Talbot Grumbling, RN

## 2022-05-09 ENCOUNTER — Telehealth: Payer: Medicaid Other | Admitting: Physician Assistant

## 2022-05-09 DIAGNOSIS — J019 Acute sinusitis, unspecified: Secondary | ICD-10-CM | POA: Diagnosis not present

## 2022-05-09 DIAGNOSIS — B9689 Other specified bacterial agents as the cause of diseases classified elsewhere: Secondary | ICD-10-CM

## 2022-05-09 MED ORDER — AMOXICILLIN-POT CLAVULANATE 875-125 MG PO TABS: 1.0000 | ORAL_TABLET | Freq: Two times a day (BID) | ORAL | 0 refills | Status: DC

## 2022-05-09 NOTE — Progress Notes (Signed)
E-Visit for Sinus Problems  We are sorry that you are not feeling well.  Here is how we plan to help!  Based on what you have shared with me it looks like you have sinusitis.  Sinusitis is inflammation and infection in the sinus cavities of the head.  Based on your presentation I believe you most likely have Acute Bacterial Sinusitis.  This is an infection caused by bacteria and is treated with antibiotics. I have prescribed Augmentin 875mg /125mg  one tablet twice daily with food, for 7 days. You may use an oral decongestant such as Mucinex D or if you have glaucoma or high blood pressure use plain Mucinex. Saline nasal spray help and can safely be used as often as needed for congestion.  Try to avoid rubbing the eyes. Apply warm compress to promote the drainage. You can also start OTC Visine drops.  If you develop worsening sinus pain, fever or notice severe headache and vision changes, or if symptoms are not better after completion of antibiotic, please schedule an appointment with a health care provider.    Sinus infections are not as easily transmitted as other respiratory infection, however we still recommend that you avoid close contact with loved ones, especially the very young and elderly.  Remember to wash your hands thoroughly throughout the day as this is the number one way to prevent the spread of infection!  Home Care: Only take medications as instructed by your medical team. Complete the entire course of an antibiotic. Do not take these medications with alcohol. A steam or ultrasonic humidifier can help congestion.  You can place a towel over your head and breathe in the steam from hot water coming from a faucet. Avoid close contacts especially the very young and the elderly. Cover your mouth when you cough or sneeze. Always remember to wash your hands.  Get Help Right Away If: You develop worsening fever or sinus pain. You develop a severe head ache or visual changes. Your symptoms  persist after you have completed your treatment plan.  Make sure you Understand these instructions. Will watch your condition. Will get help right away if you are not doing well or get worse.  Thank you for choosing an e-visit.  Your e-visit answers were reviewed by a board certified advanced clinical practitioner to complete your personal care plan. Depending upon the condition, your plan could have included both over the counter or prescription medications.  Please review your pharmacy choice. Make sure the pharmacy is open so you can pick up prescription now. If there is a problem, you may contact your provider through [employer] and have the prescription routed to another pharmacy.  Your safety is important to [place]. If you have drug allergies check your prescription carefully.   For the next 24 hours you can use MyChart to ask questions about today's visit, request a non-urgent call back, or ask for a work or school excuse. You will get an email in the next two days asking about your experience. I hope that your e-visit has been valuable and will speed your recovery.

## 2022-05-09 NOTE — Progress Notes (Signed)
I have spent 5 minutes in review of e-visit questionnaire, review and updating patient chart, medical decision making and response to patient.   Jocelyne Reinertsen Cody Lovelace Cerveny, PA-C    

## 2022-06-10 ENCOUNTER — Ambulatory Visit (INDEPENDENT_AMBULATORY_CARE_PROVIDER_SITE_OTHER): Payer: Medicaid Other | Admitting: Student

## 2022-06-10 VITALS — BP 122/78 | HR 79 | Ht 65.0 in | Wt 180.4 lb

## 2022-06-10 DIAGNOSIS — H5789 Other specified disorders of eye and adnexa: Secondary | ICD-10-CM

## 2022-06-10 MED ORDER — POLYMYXIN B-TRIMETHOPRIM 10000-0.1 UNIT/ML-% OP SOLN: 1.0000 [drp] | OPHTHALMIC | 0 refills | Status: DC

## 2022-06-10 NOTE — Patient Instructions (Addendum)
It was great to see you! Thank you for allowing me to participate in your care!   Our plans for today:  - I have referred you to [facility] eye care, please call to schedule an appointment: [phone] - I sent in a prescription for anti-bacterial eye drops. Use every 4 hours for 7-10 days   Take care and seek immediate care sooner if you develop any concerns.   Dr. Precious Gilding, DO [facility]

## 2022-06-10 NOTE — Progress Notes (Signed)
    SUBJECTIVE:   CHIEF COMPLAINT / HPI:   L eye drainage Pt seen for e-visit 05/09/21 and diagnosed with acute bacterial sinusitis and prescribed oral amoxicillin which she completed. She did have watery eye drainage at that time. Today, she is no longer having sinus pain, still having drainage from L eye, but now green in color. It crusts up throughout the day. She denies pain at rest or with eye movement but it does itch. She states vision is a little blurry she thinks because of the constant drainage.  Has been applying hot compress but not consistently   OBJECTIVE:   BP 122/78   Pulse 79   Ht 5\' 5"  (1.651 m)   Wt 180 lb 6.4 oz (81.8 kg)   SpO2 98%   BMI 30.02 kg/m    General: NAD, pleasant, able to participate in exam HEENT: White sclera, clear conjunctive eye, yellowish-green drainage from inner canthus of left eye, no erythema or edema of eyelids, EOEMI with out pain, PERRL Cardiac: Well-perfused Respiratory: Breathing comfortably on room air Skin: warm and dry Neuro: alert, no obvious focal deficits Psych: Normal affect and mood  ASSESSMENT/PLAN:   Eye drainage No edema or erythema of eye lid or injected conjunctiva makes this less likely a bacterial conjunctivitis.  However, since it has been going on for over a month and drainage is becoming more purulent appearing, will treat with antibacterial drops.  Vision is slightly worse in the left eye, 20/50 compared to right eye 20/40. I think it is possible this could be a clogged nasolacrimal duct s/p sinus infection.  -Poly trim eye drops -Refer to ophthalmology -Warm compresses multiple times a day and massage naso lacrimal duct      Dr. Precious Gilding, [place]

## 2022-06-10 NOTE — Assessment & Plan Note (Addendum)
No edema or erythema of eye lid or injected conjunctiva makes this less likely a bacterial conjunctivitis.  However, since it has been going on for over a month and drainage is becoming more purulent appearing, will treat with antibacterial drops.  Vision is slightly worse in the left eye, 20/50 compared to right eye 20/40. I think it is possible this could be a clogged nasolacrimal duct s/p sinus infection.  -Poly trim eye drops -Refer to ophthalmology -Warm compresses multiple times a day and massage naso lacrimal duct

## 2022-06-27 ENCOUNTER — Ambulatory Visit (INDEPENDENT_AMBULATORY_CARE_PROVIDER_SITE_OTHER): Payer: Medicaid Other

## 2022-06-27 DIAGNOSIS — Z309 Encounter for contraceptive management, unspecified: Secondary | ICD-10-CM | POA: Diagnosis not present

## 2022-06-27 MED ORDER — MEDROXYPROGESTERONE ACETATE 150 MG/ML IM SUSY
150.0000 mg | PREFILLED_SYRINGE | Freq: Once | INTRAMUSCULAR | Status: AC
  Administered 2022-06-27: 150 mg via INTRAMUSCULAR

## 2022-06-27 NOTE — Progress Notes (Signed)
Patient here today for Depo Provera injection and is within her dates.    Last contraceptive appt was 01/21/22  Depo given in [place] today.  Site unremarkable & patient tolerated injection.    Next injection due 09/12/22-09/26/22.  Reminder card given.    Talbot Grumbling, RN

## 2022-08-02 ENCOUNTER — Other Ambulatory Visit (HOSPITAL_COMMUNITY)
Admission: RE | Admit: 2022-08-02 | Discharge: 2022-08-02 | Disposition: A | Discharge status: Home / Self Care | Payer: Medicaid Other | Source: Ambulatory Visit | Attending: Family Medicine | Admitting: Family Medicine

## 2022-08-02 ENCOUNTER — Other Ambulatory Visit: Payer: Self-pay

## 2022-08-02 ENCOUNTER — Encounter: Payer: Self-pay | Admitting: Family Medicine

## 2022-08-02 ENCOUNTER — Ambulatory Visit (INDEPENDENT_AMBULATORY_CARE_PROVIDER_SITE_OTHER): Payer: Medicaid Other | Admitting: Family Medicine

## 2022-08-02 VITALS — BP 139/94 | HR 75 | Ht 65.0 in | Wt 178.0 lb

## 2022-08-02 DIAGNOSIS — Z124 Encounter for screening for malignant neoplasm of cervix: Secondary | ICD-10-CM | POA: Insufficient documentation

## 2022-08-02 DIAGNOSIS — N898 Other specified noninflammatory disorders of vagina: Secondary | ICD-10-CM | POA: Diagnosis not present

## 2022-08-02 LAB — POCT WET PREP (WET MOUNT)
Clue Cells Wet Prep Whiff POC: POSITIVE
Trichomonas Wet Prep HPF POC: ABSENT
WBC, Wet Prep HPF POC: >20

## 2022-08-02 NOTE — Patient Instructions (Signed)
It was wonderful to see you today.  Today we talked about:  Vaginal discharge - I will follow up with you regarding the results of the STI testing and pap smear and can send medications after the results.   Thank you for choosing [facility].   Please call [phone] with any questions about today's appointment.  Please be sure to schedule follow up at the front desk before you leave today.   Lockie Mola, MD  Family Medicine

## 2022-08-02 NOTE — Progress Notes (Signed)
    SUBJECTIVE:   CHIEF COMPLAINT / HPI:   Vaginal discharge  Patient says she has noticed vaginal odor for the past 3 days as well as increase in discharge. Discharge has not changed in color. Patient has had 2 sexual partners in the last 2 months. Does not know if they have been tested. Denies any fever, pelvic pain, vulvar or vaginal lesions.   PERTINENT  PMH / PSH: None  OBJECTIVE:   BP (!) 139/94   Pulse 75   Ht 5\' 5"  (1.651 m)   Wt 178 lb (80.7 kg)   SpO2 100%   BMI 29.62 kg/m   General: well appearing, in no acute distress CV: RRR, radial pulses equal and palpable, no BLE edema  Resp: Normal work of breathing on room air, CTAB Abd: Soft, non tender, non distended  Neuro: Alert & Oriented x 4  GU: chaperoned by CMA white clumpy discharge, some odor, no lesions on exam. 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    ASSESSMENT/PLAN:   Pap smear for cervical cancer screening -     Cytology - PAP  Vaginal discharge Assessment & Plan: Patient does seem to have either yeast or BV on exam; however, given that patient reports history of having both in the past will await wet prep and then treat.   Orders: -     POCT Wet Prep Mellody [place])      Lockie Mola, MD [facility]

## 2022-08-03 DIAGNOSIS — N898 Other specified noninflammatory disorders of vagina: Secondary | ICD-10-CM | POA: Insufficient documentation

## 2022-08-03 MED ORDER — METRONIDAZOLE 500 MG PO TABS: 500.0000 mg | ORAL_TABLET | Freq: Two times a day (BID) | ORAL | 0 refills | Status: DC

## 2022-08-03 NOTE — Addendum Note (Signed)
Addended by: Lockie Mola on: 08/03/2022 11:19 PM   Modules accepted: Orders

## 2022-08-03 NOTE — Assessment & Plan Note (Signed)
Patient does seem to have either yeast or BV on exam; however, given that patient reports history of having both in the past will await wet prep and then treat.

## 2022-08-06 LAB — CYTOLOGY - PAP
Chlamydia: NEGATIVE
Comment: NEGATIVE
Comment: NEGATIVE
Comment: NEGATIVE
Comment: NORMAL
Diagnosis: NEGATIVE
High risk HPV: NEGATIVE
Neisseria Gonorrhea: NEGATIVE
Trichomonas: NEGATIVE

## 2022-09-18 ENCOUNTER — Ambulatory Visit (INDEPENDENT_AMBULATORY_CARE_PROVIDER_SITE_OTHER): Payer: Medicaid Other

## 2022-09-18 DIAGNOSIS — Z3042 Encounter for surveillance of injectable contraceptive: Secondary | ICD-10-CM

## 2022-09-18 MED ORDER — MEDROXYPROGESTERONE ACETATE 150 MG/ML IM SUSY
150.0000 mg | PREFILLED_SYRINGE | Freq: Once | INTRAMUSCULAR | Status: AC
  Administered 2022-09-18: 150 mg via INTRAMUSCULAR

## 2022-09-18 NOTE — Progress Notes (Signed)
Patient here today for Depo Provera injection and is within her dates.     Last contraceptive appt was 01/21/22.   Depo given in [place] today.  Site unremarkable & patient tolerated injection.     Next injection due 12/04/2022-12/18/2022.  Reminder card given.

## 2022-10-28 ENCOUNTER — Encounter (HOSPITAL_COMMUNITY): Payer: Self-pay

## 2022-10-28 ENCOUNTER — Other Ambulatory Visit: Payer: Self-pay

## 2022-10-28 ENCOUNTER — Ambulatory Visit (HOSPITAL_COMMUNITY)
Admission: RE | Admit: 2022-10-28 | Discharge: 2022-10-28 | Disposition: A | Discharge status: Home / Self Care | Payer: Medicaid Other | Source: Ambulatory Visit | Attending: Emergency Medicine | Admitting: Emergency Medicine

## 2022-10-28 VITALS — BP 135/88 | HR 68 | Temp 99.7°F | Resp 18

## 2022-10-28 DIAGNOSIS — N898 Other specified noninflammatory disorders of vagina: Secondary | ICD-10-CM | POA: Diagnosis present

## 2022-10-28 NOTE — ED Triage Notes (Signed)
Pt reports having a new sex partner and has a foul smelling Vag discharge.

## 2022-10-28 NOTE — Discharge Instructions (Addendum)
May continue use of MetroGel for prophylactic treatment of bacterial vaginosis while you are waiting for your results  Labs pending 2-3 days, you will be contacted if positive for any sti and treatment will be sent to the pharmacy, you will have to return to the clinic if positive for gonorrhea to receive treatment   Please refrain from having sex until labs results, if positive please refrain from having sex until treatment complete and symptoms resolve   If positive for  Chlamydia  gonorrhea or trichomoniasis please notify partner or partners so they may tested as well  Moving forward, it is recommended you use some form of protection against the transmission of sti infections  such as condoms or dental dams with each sexual encounter

## 2022-10-28 NOTE — ED Provider Notes (Signed)
[facility]    CSN: [id number] Arrival date & time: 10/28/22  1523      History   Chief Complaint Chief Complaint  Patient presents with   Exposure to STD    Entered by patient    HPI Ann Boyd is a 22 y.o. female.   Patient presents for evaluation of vaginal discharge with odor beginning 2 days ago.  Sexually active, new partner, no known exposure.  Began use of an old prescription of MetroGel 1 day ago, unsure of effectiveness.  Denies urinary symptoms, abdominal pain or pressure, flank pain, fevers.    Past Medical History:  Diagnosis Date   Hx of chlamydia infection 09/2018   Hx of gonorrhea 09/2018   Hx of trichomoniasis 09/2017   Medical history non-contributory     Patient Active Problem List   Diagnosis Date Noted   Vaginal discharge 08/03/2022   Eye drainage 06/10/2022   Visit for TB skin test 01/21/2022   Screen for STD (sexually transmitted disease) 11/21/2021   Contraceptive surveillance 06/27/2020   History of COVID-19 05/01/2020   History of gestational hypertension 05/01/2020   COVID-19 affecting pregnancy in third trimester 04/04/2020    Past Surgical History:  Procedure Laterality Date   NO PAST SURGERIES      OB History     Gravida  2   Para  1   Term  1   Preterm      AB  1   Living  1      SAB      IAB      Ectopic      Multiple  0   Live Births  1            Home Medications    Prior to Admission medications   Medication Sig Start Date End Date Taking? Authorizing Provider  metroNIDAZOLE (FLAGYL) 500 MG tablet Take 1 tablet (500 mg total) by mouth 2 (two) times daily. 08/03/22   Lockie Mola, MD  trimethoprim-polymyxin b (POLYTRIM) ophthalmic solution Place 1 drop into the left eye every 4 (four) hours. For 7-10 days 06/10/22   Erick Alley, DO  ferrous gluconate (FERGON) 324 MG tablet Take 1 tablet (324 mg total) by mouth daily with breakfast. Patient not taking: Reported on 05/01/2020 09/13/19  05/09/20  Sharyon Cable, CNM  medroxyPROGESTERone (DEPO-PROVERA) 150 MG/ML injection Inject 1 mL (150 mg total) into the muscle every 3 (three) months. 11/26/12 11/30/18  Dessa Phi, MD    Family History Family History  Problem Relation Age of Onset   Healthy Mother    Hypertension Mother    Healthy Father    ADD / ADHD Brother     Social History Social History   Tobacco Use   Smoking status: Never   Smokeless tobacco: Never  Vaping Use   Vaping status: Never Used  Substance Use Topics   Alcohol use: No   Drug use: No     Allergies   Patient has no known allergies.   Review of Systems Review of Systems   Physical Exam Triage Vital Signs ED Triage Vitals  Encounter Vitals Group     BP 10/28/22 1536 135/88     Systolic BP Percentile --      Diastolic BP Percentile --      Pulse Rate 10/28/22 1536 68     Resp 10/28/22 1536 18     Temp 10/28/22 1536 99.7 F (37.6 C)     Temp src --  SpO2 10/28/22 1536 98 %     Weight --      Height --      Head Circumference --      Peak Flow --      Pain Score 10/28/22 1535 0     Pain Loc --      Pain Education --      Exclude from Growth Chart --    No data found.  Updated Vital Signs BP 135/88   Pulse 68   Temp 99.7 F (37.6 C)   Resp 18   SpO2 98%   Visual Acuity Right Eye Distance:   Left Eye Distance:   Bilateral Distance:    Right Eye Near:   Left Eye Near:    Bilateral Near:     Physical Exam Constitutional:      Appearance: Normal appearance.  Eyes:     Extraocular Movements: Extraocular movements intact.  Pulmonary:     Effort: Pulmonary effort is normal.  Genitourinary:    Comments: deferred Neurological:     Mental Status: She is alert and oriented to person, place, and time. Mental status is at baseline.      UC Treatments / Results  Labs (all labs ordered are listed, but only abnormal results are displayed) Labs Reviewed  CERVICOVAGINAL ANCILLARY ONLY     EKG   Radiology No results found.  Procedures Procedures (including critical care time)  Medications Ordered in UC Medications - No data to display  Initial Impression / Assessment and Plan / UC Course  I have reviewed the triage vital signs and the nursing notes.  Pertinent labs & imaging results that were available during my care of the patient were reviewed by me and considered in my medical decision making (see chart for details).  Vaginal discharge   May continue prophylactic treatment with MetroGel, STI labs pending will treat per protocol, advised abstinence until lab results, and/or treatment is complete, advised condom use during all sexual encounters moving, may follow-up with urgent care as needed  Final Clinical Impressions(s) / UC Diagnoses   Final diagnoses:  Vaginal discharge   Discharge Instructions   None    ED Prescriptions   None    PDMP not reviewed this encounter.   Valinda Hoar, [place] 10/28/22 [phone]

## 2022-10-30 ENCOUNTER — Telehealth (HOSPITAL_COMMUNITY): Payer: Self-pay | Admitting: Emergency Medicine

## 2022-10-30 MED ORDER — METRONIDAZOLE 0.75 % VA GEL: 1.0000 | Freq: Every day | VAGINAL | 0 refills | Status: AC

## 2022-10-30 MED ORDER — FLUCONAZOLE 150 MG PO TABS: 150.0000 mg | ORAL_TABLET | Freq: Once | ORAL | 0 refills | Status: AC

## 2022-11-25 ENCOUNTER — Other Ambulatory Visit: Payer: Self-pay

## 2022-11-25 DIAGNOSIS — Z20822 Contact with and (suspected) exposure to covid-19: Secondary | ICD-10-CM | POA: Insufficient documentation

## 2022-11-25 DIAGNOSIS — J069 Acute upper respiratory infection, unspecified: Secondary | ICD-10-CM | POA: Diagnosis not present

## 2022-11-25 DIAGNOSIS — R059 Cough, unspecified: Secondary | ICD-10-CM | POA: Diagnosis present

## 2022-11-25 NOTE — ED Triage Notes (Signed)
Pt states that she has had cough, sore throat, and body aches x 3 days. Pt works in nursing home.

## 2022-11-26 ENCOUNTER — Emergency Department (HOSPITAL_BASED_OUTPATIENT_CLINIC_OR_DEPARTMENT_OTHER)
Admission: EM | Admit: 2022-11-26 | Discharge: 2022-11-26 | Disposition: A | Discharge status: Home / Self Care | Payer: Medicaid Other | Attending: Emergency Medicine | Admitting: Emergency Medicine

## 2022-11-26 ENCOUNTER — Encounter (HOSPITAL_BASED_OUTPATIENT_CLINIC_OR_DEPARTMENT_OTHER): Payer: Self-pay

## 2022-11-26 DIAGNOSIS — J069 Acute upper respiratory infection, unspecified: Secondary | ICD-10-CM

## 2022-11-26 LAB — RESP PANEL BY RT-PCR (RSV, FLU A&B, COVID)  RVPGX2
Influenza A by PCR: NEGATIVE
Influenza B by PCR: NEGATIVE
Resp Syncytial Virus by PCR: NEGATIVE
SARS Coronavirus 2 by RT PCR: NEGATIVE

## 2022-11-26 MED ORDER — DOXYCYCLINE HYCLATE 100 MG PO CAPS: 100.0000 mg | ORAL_CAPSULE | Freq: Two times a day (BID) | ORAL | 0 refills | Status: DC

## 2022-11-26 NOTE — Discharge Instructions (Addendum)
Drink plenty of fluids and get plenty of rest.  Take over-the-counter medications as needed for relief of symptoms.  If you are not improving in the next 3 to 4 days, fill the prescription for doxycycline you have been provided this evening.

## 2022-11-26 NOTE — ED Provider Notes (Signed)
[place] EMERGENCY DEPARTMENT AT [facility] Provider Note   CSN: [id number] Arrival date & time: 11/25/22  2348     History  Chief Complaint  Patient presents with   Cough    Ann Boyd is a 22 y.o. female.  Patient is a 22 year old female with no significant past medical history.  Patient presenting with cough, congestion, body aches, and feeling generally unwell for the past 3 days.  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  She denies any chest pain or difficulty breathing.  The history is provided by the patient.       Home Medications Prior to Admission medications   Medication Sig Start Date End Date Taking? Authorizing Provider  trimethoprim-polymyxin b (POLYTRIM) ophthalmic solution Place 1 drop into the left eye every 4 (four) hours. For 7-10 days 06/10/22   Erick Alley, DO  ferrous gluconate (FERGON) 324 MG tablet Take 1 tablet (324 mg total) by mouth daily with breakfast. Patient not taking: Reported on 05/01/2020 09/13/19 05/09/20  Sharyon Cable, CNM  medroxyPROGESTERone (DEPO-PROVERA) 150 MG/ML injection Inject 1 mL (150 mg total) into the muscle every 3 (three) months. 11/26/12 11/30/18  Dessa Phi, MD      Allergies    Patient has no known allergies.    Review of Systems   Review of Systems  All other systems reviewed and are negative.   Physical Exam Updated Vital Signs BP (!) 146/100 (BP Location: Left Arm)   Pulse 84   Temp 98.3 F (36.8 C) (Oral)   Resp 20   Ht 5\' 5"  (1.651 m)   Wt 81.6 kg   SpO2 100%   BMI 29.95 kg/m  Physical Exam Vitals and nursing note reviewed.  Constitutional:      General: She is not in acute distress.    Appearance: She is well-developed. She is not diaphoretic.  HENT:     Head: Normocephalic and atraumatic.     Right Ear: Tympanic membrane normal.     Left Ear: Tympanic membrane normal.     Mouth/Throat:     Mouth: Mucous membranes are moist.      Pharynx: No oropharyngeal exudate or posterior oropharyngeal erythema.  Cardiovascular:     Rate and Rhythm: Normal rate and regular rhythm.     Heart sounds: No murmur heard.    No friction rub. No gallop.  Pulmonary:     Effort: Pulmonary effort is normal. No respiratory distress.     Breath sounds: Normal breath sounds. No wheezing.  Abdominal:     General: Bowel sounds are normal. There is no distension.     Palpations: Abdomen is soft.     Tenderness: There is no abdominal tenderness.  Musculoskeletal:        General: Normal range of motion.     Cervical back: Normal range of motion and neck supple.  Skin:    General: Skin is warm and dry.  Neurological:     General: No focal deficit present.     Mental Status: She is alert and oriented to person, place, and time.     ED Results / Procedures / Treatments   Labs (all labs ordered are listed, but only abnormal results are displayed) Labs Reviewed  RESP PANEL BY RT-PCR (RSV, FLU A&B, COVID)  RVPGX2    EKG None  Radiology No results found.  Procedures Procedures    Medications Ordered in ED Medications - No data  to display  ED Course/ Medical Decision Making/ A&P  I highly suspect a viral etiology and will recommend over-the-counter medications, plenty of fluids, rest, and time.  If patient is not improving in the next few days, I will prescribe a prescription which she can get filled for an antibiotic.  To return as needed if symptoms worsen or change.  Final Clinical Impression(s) / ED Diagnoses Final diagnoses:  None    Rx / DC Orders ED Discharge Orders     None         Geoffery Lyons, MD 11/26/22 [phone]

## 2023-01-02 ENCOUNTER — Ambulatory Visit: Payer: Medicaid Other

## 2023-01-02 DIAGNOSIS — Z309 Encounter for contraceptive management, unspecified: Secondary | ICD-10-CM

## 2023-01-02 LAB — POCT URINE PREGNANCY: Preg Test, Ur: NEGATIVE

## 2023-01-02 MED ORDER — MEDROXYPROGESTERONE ACETATE 150 MG/ML IM SUSP
150.0000 mg | Freq: Once | INTRAMUSCULAR | Status: AC
  Administered 2023-01-02: 150 mg via INTRAMUSCULAR

## 2023-01-02 NOTE — Progress Notes (Signed)
Patient here today for Depo Provera injection and is not within her dates. Urine pregnancy obtained and was negative. Patient was last sexually active last Thursday, 12/26/22.   Spoke with Dr. Manson Passey regarding patient. Advised that patient could receive depo today, given that urine pregnancy was negative. Advised to counsel patient to use condoms for the next seven days and for patient to take repeat urine pregnancy test in two weeks.   Counseled patient on the above recommendations per Dr. Manson Passey.   Last contraceptive appt was 01/21/2022. Advised patient that she should schedule annual contraceptive appointment for next depo injection.   Depo given in RUOQ today.  Site unremarkable & patient tolerated injection.    Next injection due 03/20/2023-04/03/2023.  Reminder card given.    Veronda Prude, RN

## 2023-01-15 ENCOUNTER — Telehealth: Payer: Medicaid Other | Admitting: Physician Assistant

## 2023-01-15 DIAGNOSIS — R6889 Other general symptoms and signs: Secondary | ICD-10-CM

## 2023-01-15 MED ORDER — OSELTAMIVIR PHOSPHATE 75 MG PO CAPS: 75.0000 mg | ORAL_CAPSULE | Freq: Two times a day (BID) | ORAL | 0 refills | Status: DC

## 2023-01-15 NOTE — Progress Notes (Signed)
Virtual Visit Consent   [name], you are scheduled for a virtual visit with a [facility] provider today. Just as with appointments in the office, your consent must be obtained to participate. Your consent will be active for this visit and any virtual visit you may have with one of our providers in the next 365 days. If you have a MyChart account, a copy of this consent can be sent to you electronically.  As this is a virtual visit, video technology does not allow for your provider to perform a traditional examination. This may limit your provider's ability to fully assess your condition. If your provider identifies any concerns that need to be evaluated in person or the need to arrange testing (such as labs, EKG, etc.), we will make arrangements to do so. Although advances in technology are sophisticated, we cannot ensure that it will always work on either your end or our end. If the connection with a video visit is poor, the visit may have to be switched to a telephone visit. With either a video or telephone visit, we are not always able to ensure that we have a secure connection.  By engaging in this virtual visit, you consent to the provision of healthcare and authorize for your insurance to be billed (if applicable) for the services provided during this visit. Depending on your insurance coverage, you may receive a charge related to this service.  I need to obtain your verbal consent now. Are you willing to proceed with your visit today? Ann Boyd has provided verbal consent on 01/15/2023 for a virtual visit (video or telephone). [name], [place]  Date: 01/15/2023 11:07 AM  Virtual Visit via Video Note   I, [name], connected with  Ann Boyd  ([id number], 2001/03/07) on 01/15/23 at 11:00 AM EDT by a video-enabled telemedicine application and verified that I am speaking with the correct person using two identifiers.  Location: Patient: Virtual Visit Location  Patient: Home Provider: Virtual Visit Location Provider: Home Office   I discussed the limitations of evaluation and management by telemedicine and the availability of in person appointments. The patient expressed understanding and agreed to proceed.    History of Present Illness: Ann Boyd is a 22 y.o. who identifies as a female who was assigned female at birth, and is being seen today for a possible sinusitis. Endorses symptoms starting 2 days ago with sore throat, nasal congestion, drainage, chills, aching and hoarseness. Denies fever. Denies shortness of breath. Mild cough. Feels like symptoms are worsening instead of easing up. Denies recent travel or sick contact.   OTC -- Theraflu, Robitussin  Has tested for COVID through her job. This was negative.   HPI: HPI  Problems:  Patient Active Problem List   Diagnosis Date Noted   Vaginal discharge 08/03/2022   Eye drainage 06/10/2022   Visit for TB skin test 01/21/2022   Screen for STD (sexually transmitted disease) 11/21/2021   Contraceptive surveillance 06/27/2020   History of COVID-19 05/01/2020   History of gestational hypertension 05/01/2020   COVID-19 affecting pregnancy in third trimester 04/04/2020    Allergies: No Known Allergies Medications:  Current Outpatient Medications:    oseltamivir (TAMIFLU) 75 MG capsule, Take 1 capsule (75 mg total) by mouth 2 (two) times daily., Disp: 10 capsule, Rfl: 0  Observations/Objective: Patient is well-developed, well-nourished in no acute distress.  Resting comfortably at home.  Head is normocephalic, atraumatic.  No labored breathing. Speech is clear and coherent with logical  content.  Patient is alert and oriented at baseline.   Assessment and Plan: 1. Flu-like symptoms - oseltamivir (TAMIFLU) 75 MG capsule; Take 1 capsule (75 mg total) by mouth 2 (two) times daily.  Dispense: 10 capsule; Refill: 0  Negative COVID. Classic influenza symptoms. Unknown exposure. Supportive  measures, OTC medications and Vitamin recommendations reviewed. Will start Tamiflu per orders.Quarantine reviewed with patient.    Follow Up Instructions: I discussed the assessment and treatment plan with the patient. The patient was provided an opportunity to ask questions and all were answered. The patient agreed with the plan and demonstrated an understanding of the instructions.  A copy of instructions were sent to the patient via MyChart unless otherwise noted below.   The patient was advised to call back or seek an in-person evaluation if the symptoms worsen or if the condition fails to improve as anticipated.    [name], PA-C

## 2023-01-15 NOTE — Patient Instructions (Signed)
Ann Boyd, thank you for joining [name], PA-C for today's virtual visit.  While this provider is not your primary care provider (PCP), if your PCP is located in our provider database this encounter information will be shared with them immediately following your visit.   A [facility] MyChart account gives you access to today's visit and all your visits, tests, and labs performed at [facility] " click here if you don't have a [facility] MyChart account or go to [email or web address]  Consent: (Patient) Ann Boyd provided verbal consent for this virtual visit at the beginning of the encounter.  Current Medications:  Current Outpatient Medications:    doxycycline (VIBRAMYCIN) 100 MG capsule, Take 1 capsule (100 mg total) by mouth 2 (two) times daily. One po bid x 7 days, Disp: 14 capsule, Rfl: 0   trimethoprim-polymyxin b (POLYTRIM) ophthalmic solution, Place 1 drop into the left eye every 4 (four) hours. For 7-10 days, Disp: 10 mL, Rfl: 0   Medications ordered in this encounter:  No orders of the defined types were placed in this encounter.    *If you need refills on other medications prior to your next appointment, please contact your pharmacy*  Follow-Up: Call back or seek an in-person evaluation if the symptoms worsen or if the condition fails to improve as anticipated.  [facility] [phone]  Other Instructions Please keep well-hydrated and try to get plenty of rest. If you have a humidifier, place it in the bedroom and run it at night. Start a saline nasal rinse for nasal congestion. You can consider use of a nasal steroid spray like Flonase or Nasacort OTC. You can alternate between Tylenol and Ibuprofen if needed for fever, body aches, headache and/or throat pain. Salt water-gargles and chloraseptic spray can be very beneficial for sore throat. Mucinex-DM for congestion or cough. Please take all prescribed medications as  directed.  Remain out of work until [employer] for 24 hours without a fever-reducing medication, and you are feeling better.  You should mask until symptoms are resolved.  If anything worsens despite treatment, you need to be evaluated in-person. Please do not delay care.  Influenza, Adult Influenza is also called "the flu." It is an infection in the lungs, nose, and throat (respiratory tract). It spreads easily from person to person (is contagious). The flu causes symptoms that are like a cold, along with high fever and body aches. What are the causes? This condition is caused by the influenza virus. You can get the virus by: Breathing in droplets that are in the air after a person infected with the flu coughed or sneezed. Touching something that has the virus on it and then touching your mouth, nose, or eyes. What increases the risk? Certain things may make you more likely to get the flu. These include: Not washing your hands often. Having close contact with many people during cold and flu season. Touching your mouth, eyes, or nose without first washing your hands. Not getting a flu shot every year. You may have a higher risk for the flu, and serious problems, such as a lung infection (pneumonia), if you: Are older than 65. Are pregnant. Have a weakened disease-fighting system (immune system) because of a disease or because you are taking certain medicines. Have a long-term (chronic) condition, such as: Heart, kidney, or lung disease. Diabetes. Asthma. Have a liver disorder. Are very overweight (morbidly obese). Have anemia. What are the signs or symptoms? Symptoms usually begin suddenly and  last 4-14 days. They may include: Fever and chills. Headaches, body aches, or muscle aches. Sore throat. Cough. Runny or stuffy (congested) nose. Feeling discomfort in your chest. Not wanting to eat as much as normal. Feeling weak or tired. Feeling dizzy. Feeling sick to your stomach or  throwing up. How is this treated? If the flu is found early, you can be treated with antiviral medicine. This can help to reduce how bad the illness is and how long it lasts. This may be given by mouth or through an IV tube. Taking care of yourself at home can help your symptoms get better. Your doctor may want you to: Take over-the-counter medicines. Drink plenty of fluids. The flu often goes away on its own. If you have very bad symptoms or other problems, you may be treated in a hospital. Follow these instructions at home:     Activity Rest as needed. Get plenty of sleep. Stay home from work or school as told by your doctor. Do not leave home until you do not have a fever for 24 hours without taking medicine. Leave home only to go to your doctor. Eating and drinking Take an ORS (oral rehydration solution). This is a drink that is sold at pharmacies and stores. Drink enough fluid to keep your pee pale yellow. Drink clear fluids in small amounts as you are able. Clear fluids include: Water. Ice chips. Fruit juice mixed with water. Low-calorie sports drinks. Eat bland foods that are easy to digest. Eat small amounts as you are able. These foods include: Bananas. Applesauce. Rice. Lean meats. Toast. Crackers. Do not eat or drink: Fluids that have a lot of sugar or caffeine. Alcohol. Spicy or fatty foods. General instructions Take over-the-counter and prescription medicines only as told by your doctor. Use a cool mist humidifier to add moisture to the air in your home. This can make it easier for you to breathe. When using a cool mist humidifier, clean it daily. Empty water and replace with clean water. Cover your mouth and nose when you cough or sneeze. Wash your hands with soap and water often and for at least 20 seconds. This is also important after you cough or sneeze. If you cannot use soap and water, use alcohol-based hand sanitizer. Keep all follow-up visits. How is  this prevented?  Get a flu shot every year. You may get the flu shot in late summer, fall, or winter. Ask your doctor when you should get your flu shot. Avoid contact with people who are sick during fall and winter. This is cold and flu season. Contact a doctor if: You get new symptoms. You have: Chest pain. Watery poop (diarrhea). A fever. Your cough gets worse. You start to have more mucus. You feel sick to your stomach. You throw up. Get help right away if you: Have shortness of breath. Have trouble breathing. Have skin or nails that turn a bluish color. Have very bad pain or stiffness in your neck. Get a sudden headache. Get sudden pain in your face or ear. Cannot eat or drink without throwing up. These symptoms may represent a serious problem that is an emergency. Get medical help right away. Call your local emergency services (911 in the [place] Do not wait to see if the symptoms will go away. Do not drive yourself to the hospital. Summary Influenza is also called "the flu." It is an infection in the lungs, nose, and throat. It spreads easily from person to person. Take over-the-counter and  prescription medicines only as told by your doctor. Getting a flu shot every year is the best way to not get the flu. This information is not intended to replace advice given to you by your health care provider. Make sure you discuss any questions you have with your health care provider. Document Revised: 11/05/2019 Document Reviewed: 11/05/2019 Elsevier Patient Education  2023 Elsevier Inc.   If you have been instructed to have an in-person evaluation today at a local Urgent Care facility, please use the link below. It will take you to a list of all of our available [facility], including address, phone number and hours of operation. Please do not delay care.  [facility]  If you or a family member do not have a primary care provider, use the link below to  schedule a visit and establish care. When you choose a [facility] primary care physician or advanced practice provider, you gain a long-term partner in health. Find a Primary Care Provider  Learn more about [facility]'s in-office and virtual care options: [facility] - Get Care Now

## 2023-01-22 ENCOUNTER — Encounter (HOSPITAL_COMMUNITY): Payer: Self-pay

## 2023-01-22 ENCOUNTER — Ambulatory Visit (HOSPITAL_COMMUNITY)
Admission: RE | Admit: 2023-01-22 | Discharge: 2023-01-22 | Disposition: A | Discharge status: Home / Self Care | Payer: Medicaid Other | Source: Ambulatory Visit | Attending: Emergency Medicine | Admitting: Emergency Medicine

## 2023-01-22 VITALS — BP 136/86 | HR 83 | Temp 98.9°F | Resp 20

## 2023-01-22 DIAGNOSIS — J069 Acute upper respiratory infection, unspecified: Secondary | ICD-10-CM | POA: Diagnosis not present

## 2023-01-22 MED ORDER — AMOXICILLIN-POT CLAVULANATE 875-125 MG PO TABS: 1.0000 | ORAL_TABLET | Freq: Two times a day (BID) | ORAL | 0 refills | Status: DC

## 2023-01-22 MED ORDER — PROMETHAZINE-DM 6.25-15 MG/5ML PO SYRP: 5.0000 mL | ORAL_SOLUTION | Freq: Four times a day (QID) | ORAL | 0 refills | Status: DC | PRN

## 2023-01-22 NOTE — Discharge Instructions (Signed)
Take all antibiotics as prescribed and until finished, and take them with food to prevent gastrointestinal upset.  Use the cough syrup as needed, do not drink or drive on this medication as it may cause drowsiness.  I suggest sleeping with a humidifier, taking 1200 mg of Mucinex daily, in addition to at least 64 ounces of water to stay hydrated and to help loosen your secretions.  You can do warm saline gargles, tea with honey and over-the-counter cough drops to help soothe your sore throat as well.    Symptoms should improve over the next 72 hours on antibiotics, if no improvement despite antibiotics, or any new changes please return to clinic.

## 2023-01-22 NOTE — ED Provider Notes (Signed)
[facility]    CSN: [id number] Arrival date & time: 01/22/23  1401      History   Chief Complaint Chief Complaint  Patient presents with   Cough    Entered by patient    HPI Ann Boyd is a 22 y.o. female.   Patient presents to clinic for complaints of a productive cough with green sputum for over 7 days.  She did do a virtual visit on the 16th and was prescribed Tamiflu for her nasal congestion, sore throat and feeling unwell.  She did not test for the flu and did not have any fevers.  She did a virtual visit because she was at work and could not get off of work to come to the doctor.  She endorses sore throat and some chills.  Cough is productive.  Denies any shortness of breath or wheezing.  No history of asthma or respiratory diseases.  She has been taking TheraFlu over-the-counter and recently finished up her Tamiflu.    The history is provided by the patient and medical records.  Cough Associated symptoms: chills and sore throat   Associated symptoms: no chest pain, no fever, no shortness of breath and no wheezing     Past Medical History:  Diagnosis Date   Hx of chlamydia infection 09/2018   Hx of gonorrhea 09/2018   Hx of trichomoniasis 09/2017   Medical history non-contributory     Patient Active Problem List   Diagnosis Date Noted   Vaginal discharge 08/03/2022   Eye drainage 06/10/2022   Visit for TB skin test 01/21/2022   Screen for STD (sexually transmitted disease) 11/21/2021   Contraceptive surveillance 06/27/2020   History of COVID-19 05/01/2020   History of gestational hypertension 05/01/2020   COVID-19 affecting pregnancy in third trimester 04/04/2020    Past Surgical History:  Procedure Laterality Date   NO PAST SURGERIES      OB History     Gravida  2   Para  1   Term  1   Preterm      AB  1   Living  1      SAB      IAB      Ectopic      Multiple  0   Live Births  1            Home  Medications    Prior to Admission medications   Medication Sig Start Date End Date Taking? Authorizing Provider  amoxicillin-clavulanate (AUGMENTIN) 875-125 MG tablet Take 1 tablet by mouth every 12 (twelve) hours. 01/22/23  Yes Rinaldo Ratel, [place], FNP  oseltamivir (TAMIFLU) 75 MG capsule Take 1 capsule (75 mg total) by mouth 2 (two) times daily. 01/15/23  Yes Waldon Merl, PA-C  promethazine-dextromethorphan (PROMETHAZINE-DM) 6.25-15 MG/5ML syrup Take 5 mLs by mouth 4 (four) times daily as needed for cough. 01/22/23  Yes Rinaldo Ratel, [place], FNP  ferrous gluconate (FERGON) 324 MG tablet Take 1 tablet (324 mg total) by mouth daily with breakfast. Patient not taking: Reported on 05/01/2020 09/13/19 05/09/20  Sharyon Cable, CNM  medroxyPROGESTERone (DEPO-PROVERA) 150 MG/ML injection Inject 1 mL (150 mg total) into the muscle every 3 (three) months. 11/26/12 11/30/18  Dessa Phi, MD    Family History Family History  Problem Relation Age of Onset   Healthy Mother    Hypertension Mother    Healthy Father    ADD / ADHD Brother     Social History Social History  Tobacco Use   Smoking status: Never   Smokeless tobacco: Never  Vaping Use   Vaping status: Never Used  Substance Use Topics   Alcohol use: No   Drug use: No     Allergies   Patient has no known allergies.   Review of Systems Review of Systems  Constitutional:  Positive for chills. Negative for fever.  HENT:  Positive for congestion and sore throat.   Respiratory:  Positive for cough. Negative for shortness of breath and wheezing.   Cardiovascular:  Negative for chest pain.  Gastrointestinal:  Negative for abdominal pain, diarrhea, nausea and vomiting.     Physical Exam Triage Vital Signs ED Triage Vitals  Encounter Vitals Group     BP 01/22/23 1414 136/86     Systolic BP Percentile --      Diastolic BP Percentile --      Pulse Rate 01/22/23 1414 83     Resp 01/22/23 1414 20     Temp 01/22/23  1414 98.9 F (37.2 C)     Temp Source 01/22/23 1414 Oral     SpO2 01/22/23 1414 96 %     Weight --      Height --      Head Circumference --      Peak Flow --      Pain Score 01/22/23 1412 0     Pain Loc --      Pain Education --      Exclude from Growth Chart --    No data found.  Updated Vital Signs BP 136/86 (BP Location: Right Arm)   Pulse 83   Temp 98.9 F (37.2 C) (Oral)   Resp 20   SpO2 96%   Visual Acuity Right Eye Distance:   Left Eye Distance:   Bilateral Distance:    Right Eye Near:   Left Eye Near:    Bilateral Near:     Physical Exam Vitals and nursing note reviewed.  Constitutional:      Appearance: Normal appearance.  HENT:     Head: Normocephalic and atraumatic.     Right Ear: External ear normal.     Left Ear: External ear normal.     Nose: Nose normal.     Mouth/Throat:     Mouth: Mucous membranes are moist.     Pharynx: Posterior oropharyngeal erythema present.  Eyes:     Conjunctiva/sclera: Conjunctivae normal.  Cardiovascular:     Rate and Rhythm: Normal rate and regular rhythm.     Heart sounds: Normal heart sounds. No murmur heard. Pulmonary:     Effort: Pulmonary effort is normal. No respiratory distress.     Breath sounds: Normal breath sounds.  Musculoskeletal:        General: Normal range of motion.  Skin:    General: Skin is warm and dry.  Neurological:     General: No focal deficit present.     Mental Status: She is alert and oriented to person, place, and time.  Psychiatric:        Mood and Affect: Mood normal.        Behavior: Behavior normal.      UC Treatments / Results  Labs (all labs ordered are listed, but only abnormal results are displayed) Labs Reviewed - No data to display  EKG   Radiology No results found.  Procedures Procedures (including critical care time)  Medications Ordered in UC Medications - No data to display  Initial Impression / Assessment and  Plan / UC Course  I have reviewed the  triage vital signs and the nursing notes.  Pertinent labs & imaging results that were available during my care of the patient were reviewed by me and considered in my medical decision making (see chart for details).  Vitals and triage reviewed, patient is hemodynamically stable.  Lungs are vesicular, heart with regular rate and rhythm.  Posterior pharynx with erythema and postnasal drip.  Due to duration of symptoms, suspect bacterial URI.  Will cover with Augmentin, symptomatic management for cough discussed.  Discussed cough can extend beyond the life of illness and that is not unusual.  Plan of care, follow-up care and return precautions given, no questions at this time.     Final Clinical Impressions(s) / UC Diagnoses   Final diagnoses:  Acute upper respiratory infection     Discharge Instructions      Take all antibiotics as prescribed and until finished, and take them with food to prevent gastrointestinal upset.  Use the cough syrup as needed, do not drink or drive on this medication as it may cause drowsiness.  I suggest sleeping with a humidifier, taking 1200 mg of Mucinex daily, in addition to at least 64 ounces of water to stay hydrated and to help loosen your secretions.  You can do warm saline gargles, tea with honey and over-the-counter cough drops to help soothe your sore throat as well.    Symptoms should improve over the next 72 hours on antibiotics, if no improvement despite antibiotics, or any new changes please return to clinic.      ED Prescriptions     Medication Sig Dispense Auth. Provider   amoxicillin-clavulanate (AUGMENTIN) 875-125 MG tablet Take 1 tablet by mouth every 12 (twelve) hours. 14 tablet Rinaldo Ratel, [place], [place]   promethazine-dextromethorphan (PROMETHAZINE-DM) 6.25-15 MG/5ML syrup Take 5 mLs by mouth 4 (four) times daily as needed for cough. 118 mL Laria Grimmett, [place], [place]      PDMP not reviewed this encounter.   Ura Yingling, [place] N,  [place] 01/22/23 [phone]

## 2023-01-22 NOTE — ED Triage Notes (Signed)
Pt c/o productive cough (green sputum) for over one week. Had Virtual visit last week; presribed Tami-flu. No relief. Endorses sore throat and chills.

## 2023-02-16 ENCOUNTER — Ambulatory Visit
Admission: RE | Admit: 2023-02-16 | Discharge: 2023-02-16 | Disposition: A | Discharge status: Home / Self Care | Payer: Medicaid Other | Source: Ambulatory Visit | Attending: Internal Medicine | Admitting: Internal Medicine

## 2023-02-16 VITALS — BP 129/85 | HR 76 | Temp 98.5°F | Resp 16 | Ht 65.0 in | Wt 189.0 lb

## 2023-02-16 DIAGNOSIS — Z113 Encounter for screening for infections with a predominantly sexual mode of transmission: Secondary | ICD-10-CM | POA: Insufficient documentation

## 2023-02-16 DIAGNOSIS — N898 Other specified noninflammatory disorders of vagina: Secondary | ICD-10-CM | POA: Insufficient documentation

## 2023-02-16 MED ORDER — FLUCONAZOLE 150 MG PO TABS: 150.0000 mg | ORAL_TABLET | Freq: Every day | ORAL | 0 refills | Status: AC

## 2023-02-16 NOTE — ED Provider Notes (Signed)
[facility]    CSN: [id number] Arrival date & time: 02/16/23  1415      History   Chief Complaint Chief Complaint  Patient presents with   Exposure to STD    Entered by patient    HPI Ann Boyd is a 22 y.o. female presents for evaluation of vaginal discharge and STD testing.  Patient reports 3 days of a malodorous pruritic vaginal discharge.  Patient states it is consistent with previous yeast infection symptoms.  Denies any dysuria, fevers, nausea/vomiting, flank pain.  No STD exposure but as she has a new partner she would like screening.  No other concerns at this time.   Exposure to STD    Past Medical History:  Diagnosis Date   Hx of chlamydia infection 09/2018   Hx of gonorrhea 09/2018   Hx of trichomoniasis 09/2017   Medical history non-contributory     Patient Active Problem List   Diagnosis Date Noted   Vaginal discharge 08/03/2022   Eye drainage 06/10/2022   Visit for TB skin test 01/21/2022   Screen for STD (sexually transmitted disease) 11/21/2021   Contraceptive surveillance 06/27/2020   History of COVID-19 05/01/2020   History of gestational hypertension 05/01/2020   COVID-19 affecting pregnancy in third trimester 04/04/2020    Past Surgical History:  Procedure Laterality Date   NO PAST SURGERIES      OB History     Gravida  2   Para  1   Term  1   Preterm      AB  1   Living  1      SAB      IAB      Ectopic      Multiple  0   Live Births  1            Home Medications    Prior to Admission medications   Medication Sig Start Date End Date Taking? Authorizing Provider  fluconazole (DIFLUCAN) 150 MG tablet Take 1 tablet (150 mg total) by mouth daily for 2 doses. 02/16/23 02/18/23 Yes Radford Pax, NP  amoxicillin-clavulanate (AUGMENTIN) 875-125 MG tablet Take 1 tablet by mouth every 12 (twelve) hours. 01/22/23   Garrison, [place], FNP  oseltamivir (TAMIFLU) 75 MG capsule Take 1 capsule (75 mg  total) by mouth 2 (two) times daily. 01/15/23   Waldon Merl, PA-C  promethazine-dextromethorphan (PROMETHAZINE-DM) 6.25-15 MG/5ML syrup Take 5 mLs by mouth 4 (four) times daily as needed for cough. 01/22/23   Garrison, [place], FNP  ferrous gluconate (FERGON) 324 MG tablet Take 1 tablet (324 mg total) by mouth daily with breakfast. Patient not taking: Reported on 05/01/2020 09/13/19 05/09/20  Sharyon Cable, CNM  medroxyPROGESTERone (DEPO-PROVERA) 150 MG/ML injection Inject 1 mL (150 mg total) into the muscle every 3 (three) months. 11/26/12 11/30/18  Dessa Phi, MD    Family History Family History  Problem Relation Age of Onset   Healthy Mother    Hypertension Mother    Healthy Father    ADD / ADHD Brother     Social History Social History   Tobacco Use   Smoking status: Never   Smokeless tobacco: Never  Vaping Use   Vaping status: Never Used  Substance Use Topics   Alcohol use: No   Drug use: No     Allergies   Patient has no known allergies.   Review of Systems Review of Systems  Genitourinary:  Positive for vaginal discharge.  Physical Exam Triage Vital Signs ED Triage Vitals  Encounter Vitals Group     BP 02/16/23 1445 129/85     Systolic BP Percentile --      Diastolic BP Percentile --      Pulse Rate 02/16/23 1445 76     Resp 02/16/23 1445 16     Temp 02/16/23 1445 98.5 F (36.9 C)     Temp Source 02/16/23 1445 Oral     SpO2 02/16/23 1445 95 %     Weight 02/16/23 1444 189 lb (85.7 kg)     Height 02/16/23 1444 5\' 5"  (1.651 m)     Head Circumference --      Peak Flow --      Pain Score 02/16/23 1444 0     Pain Loc --      Pain Education --      Exclude from Growth Chart --    No data found.  Updated Vital Signs BP 129/85 (BP Location: Left Arm)   Pulse 76   Temp 98.5 F (36.9 C) (Oral)   Resp 16   Ht 5\' 5"  (1.651 m)   Wt 189 lb (85.7 kg)   SpO2 95% Comment: have on acrylic nails  BMI 31.45 kg/m   Visual Acuity Right Eye  Distance:   Left Eye Distance:   Bilateral Distance:    Right Eye Near:   Left Eye Near:    Bilateral Near:     Physical Exam Vitals and nursing note reviewed.  Constitutional:      Appearance: Normal appearance.  HENT:     Head: Normocephalic and atraumatic.  Eyes:     Pupils: Pupils are equal, round, and reactive to light.  Cardiovascular:     Rate and Rhythm: Normal rate.  Pulmonary:     Effort: Pulmonary effort is normal.  Abdominal:     Tenderness: There is no right CVA tenderness or left CVA tenderness.  Skin:    General: Skin is warm and dry.  Neurological:     General: No focal deficit present.     Mental Status: She is alert and oriented to person, place, and time.  Psychiatric:        Mood and Affect: Mood normal.        Behavior: Behavior normal.      UC Treatments / Results  Labs (all labs ordered are listed, but only abnormal results are displayed) Labs Reviewed  RPR  HIV ANTIBODY (ROUTINE TESTING W REFLEX)  CERVICOVAGINAL ANCILLARY ONLY    EKG   Radiology No results found.  Procedures Procedures (including critical care time)  Medications Ordered in UC Medications - No data to display  Initial Impression / Assessment and Plan / UC Course  I have reviewed the triage vital signs and the nursing notes.  Pertinent labs & imaging results that were available during my care of the patient were reviewed by me and considered in my medical decision making (see chart for details).     Reviewed exam and symptoms with patient.  No red flags.  STD testing is ordered and will contact for any positive results.  As patient reports symptoms are consistent with previous yeast infections will start Diflucan.  PCP or GYN follow-up if symptoms do not improve.  ER precautions reviewed. Final Clinical Impressions(s) / UC Diagnoses   Final diagnoses:  Vaginal discharge  Screening examination for STD (sexually transmitted disease)     Discharge Instructions       The  clinic will contact you with results of the testing done today if positive.  Start Diflucan as prescribed.  Please follow-up with your PCP or gynecologist if symptoms do not improve.  Please go to the ER for any worsening symptoms.  I hope you feel better soon!    ED Prescriptions     Medication Sig Dispense Auth. Provider   fluconazole (DIFLUCAN) 150 MG tablet Take 1 tablet (150 mg total) by mouth daily for 2 doses. 2 tablet Radford Pax, NP      PDMP not reviewed this encounter.   Radford Pax, NP 02/16/23 1520

## 2023-02-16 NOTE — Discharge Instructions (Addendum)
The clinic will contact you with results of the testing done today if positive.  Start Diflucan as prescribed.  Please follow-up with your PCP or gynecologist if symptoms do not improve.  Please go to the ER for any worsening symptoms.  I hope you feel better soon!

## 2023-02-16 NOTE — ED Triage Notes (Signed)
Patient presents for STI testing, states she have a new partner. Reports discharge and odor x day 3.

## 2023-02-17 LAB — CERVICOVAGINAL ANCILLARY ONLY
Bacterial Vaginitis (gardnerella): NEGATIVE
Candida Glabrata: NEGATIVE
Candida Vaginitis: NEGATIVE
Chlamydia: NEGATIVE
Comment: NEGATIVE
Comment: NEGATIVE
Comment: NEGATIVE
Comment: NEGATIVE
Comment: NEGATIVE
Comment: NORMAL
Neisseria Gonorrhea: NEGATIVE
Trichomonas: NEGATIVE

## 2023-02-18 LAB — RPR: RPR Ser Ql: NONREACTIVE

## 2023-02-18 LAB — HIV ANTIBODY (ROUTINE TESTING W REFLEX): HIV Screen 4th Generation wRfx: NONREACTIVE

## 2023-03-21 ENCOUNTER — Ambulatory Visit (INDEPENDENT_AMBULATORY_CARE_PROVIDER_SITE_OTHER): Payer: Medicaid Other

## 2023-03-21 DIAGNOSIS — Z309 Encounter for contraceptive management, unspecified: Secondary | ICD-10-CM

## 2023-03-24 MED ORDER — MEDROXYPROGESTERONE ACETATE 150 MG/ML IM SUSP
150.0000 mg | Freq: Once | INTRAMUSCULAR | Status: AC
  Administered 2023-03-21: 150 mg via INTRAMUSCULAR

## 2023-03-24 NOTE — Progress Notes (Signed)
Patient here today for Depo Provera injection and is within her dates.    Last contraceptive appt was 01/21/22. Spoke with Dr. Deirdre Priest prior to administration. Received verbal orders to give injection. Advised patient that this would be the last depo injection we could give without an appointment with PCP for contraceptive management. She will schedule appointment with PCP.   Depo given in [facility] today.  Site unremarkable & patient tolerated injection.    Next injection due 06/06/23-06/20/23.  Reminder card given.    Veronda Prude, RN

## 2023-04-23 ENCOUNTER — Ambulatory Visit (HOSPITAL_COMMUNITY)
Admission: RE | Admit: 2023-04-23 | Discharge: 2023-04-23 | Disposition: A | Discharge status: Home / Self Care | Payer: Medicaid Other | Source: Ambulatory Visit | Attending: Emergency Medicine | Admitting: Emergency Medicine

## 2023-04-23 ENCOUNTER — Encounter (HOSPITAL_COMMUNITY): Payer: Self-pay

## 2023-04-23 VITALS — BP 138/96 | HR 71 | Temp 97.9°F | Resp 18

## 2023-04-23 DIAGNOSIS — N760 Acute vaginitis: Secondary | ICD-10-CM | POA: Insufficient documentation

## 2023-04-23 LAB — CERVICOVAGINAL ANCILLARY ONLY
Bacterial Vaginitis (gardnerella): POSITIVE — AB
Candida Glabrata: NEGATIVE
Candida Vaginitis: NEGATIVE
Chlamydia: NEGATIVE
Comment: NEGATIVE
Comment: NEGATIVE
Comment: NEGATIVE
Comment: NEGATIVE
Comment: NEGATIVE
Comment: NORMAL
Neisseria Gonorrhea: NEGATIVE
Trichomonas: NEGATIVE

## 2023-04-23 MED ORDER — METRONIDAZOLE 0.75 % VA GEL: 1.0000 | Freq: Every day | VAGINAL | 0 refills | Status: AC

## 2023-04-23 NOTE — ED Provider Notes (Signed)
[facility]    CSN: [id number] Arrival date & time: 04/23/23  6213      History   Chief Complaint Chief Complaint  Patient presents with   Vaginal Discharge    Entered by patient    HPI Ann Boyd is a 23 y.o. female.   Patient presents to clinic complaining of an abnormal malodorous vaginal discharge for the past 3 or 4 days.  She is also noticing that she is having some vaginal bleeding and cramping.  She does get Depo injections regularly, last injection was 03/21/2023.  Sexually active with a consistent partner, monogamous, last sexually active around 2 weeks ago.  Denies any dysuria, urgency, frequency, abdominal pain, fevers or flank pain.  Hx of trichomoniasis, gonorrhea, and chlamydia.   The history is provided by the patient and medical records.  Vaginal Discharge   Past Medical History:  Diagnosis Date   Hx of chlamydia infection 09/2018   Hx of gonorrhea 09/2018   Hx of trichomoniasis 09/2017   Medical history non-contributory     Patient Active Problem List   Diagnosis Date Noted   Vaginal discharge 08/03/2022   Eye drainage 06/10/2022   Visit for TB skin test 01/21/2022   Screen for STD (sexually transmitted disease) 11/21/2021   Contraceptive surveillance 06/27/2020   History of COVID-19 05/01/2020   History of gestational hypertension 05/01/2020   COVID-19 affecting pregnancy in third trimester 04/04/2020    Past Surgical History:  Procedure Laterality Date   NO PAST SURGERIES      OB History     Gravida  2   Para  1   Term  1   Preterm      AB  1   Living  1      SAB      IAB      Ectopic      Multiple  0   Live Births  1            Home Medications    Prior to Admission medications   Medication Sig Start Date End Date Taking? Authorizing Provider  metroNIDAZOLE (METROGEL) 0.75 % vaginal gel Place 1 Applicatorful vaginally at bedtime for 5 days. 04/23/23 04/28/23 Yes Rinaldo Ratel, [place], FNP   ferrous gluconate (FERGON) 324 MG tablet Take 1 tablet (324 mg total) by mouth daily with breakfast. Patient not taking: Reported on 05/01/2020 09/13/19 05/09/20  Sharyon Cable, CNM  medroxyPROGESTERone (DEPO-PROVERA) 150 MG/ML injection Inject 1 mL (150 mg total) into the muscle every 3 (three) months. 11/26/12 11/30/18  Dessa Phi, MD    Family History Family History  Problem Relation Age of Onset   Healthy Mother    Hypertension Mother    Healthy Father    ADD / ADHD Brother     Social History Social History   Tobacco Use   Smoking status: Never   Smokeless tobacco: Never  Vaping Use   Vaping status: Never Used  Substance Use Topics   Alcohol use: No   Drug use: No     Allergies   Patient has no known allergies.   Review of Systems Review of Systems  Per HPI   Physical Exam Triage Vital Signs ED Triage Vitals  Encounter Vitals Group     BP 04/23/23 0846 (!) 138/96     Systolic BP Percentile --      Diastolic BP Percentile --      Pulse Rate 04/23/23 0846 71     Resp  04/23/23 0846 18     Temp 04/23/23 0846 97.9 F (36.6 C)     Temp src --      SpO2 04/23/23 0846 98 %     Weight --      Height --      Head Circumference --      Peak Flow --      Pain Score 04/23/23 0847 0     Pain Loc --      Pain Education --      Exclude from Growth Chart --    No data found.  Updated Vital Signs BP (!) 138/96 (BP Location: Left Arm)   Pulse 71   Temp 97.9 F (36.6 C)   Resp 18   SpO2 98%   Visual Acuity Right Eye Distance:   Left Eye Distance:   Bilateral Distance:    Right Eye Near:   Left Eye Near:    Bilateral Near:     Physical Exam Vitals and nursing note reviewed.  Constitutional:      Appearance: Normal appearance.  HENT:     Head: Normocephalic and atraumatic.     Right Ear: External ear normal.     Left Ear: External ear normal.     Nose: Nose normal.     Mouth/Throat:     Mouth: Mucous membranes are moist.  Eyes:      Conjunctiva/sclera: Conjunctivae normal.  Cardiovascular:     Rate and Rhythm: Normal rate.  Pulmonary:     Effort: Pulmonary effort is normal. No respiratory distress.  Abdominal:     Tenderness: There is no right CVA tenderness or left CVA tenderness.  Musculoskeletal:        General: Normal range of motion.  Skin:    General: Skin is warm and dry.  Neurological:     General: No focal deficit present.     Mental Status: She is alert and oriented to person, place, and time.  Psychiatric:        Mood and Affect: Mood normal.        Behavior: Behavior normal.      UC Treatments / Results  Labs (all labs ordered are listed, but only abnormal results are displayed) Labs Reviewed  CERVICOVAGINAL ANCILLARY ONLY    EKG   Radiology No results found.  Procedures Procedures (including critical care time)  Medications Ordered in UC Medications - No data to display  Initial Impression / Assessment and Plan / UC Course  I have reviewed the triage vital signs and the nursing notes.  Pertinent labs & imaging results that were available during my care of the patient were reviewed by me and considered in my medical decision making (see chart for details).  Vitals and triage reviewed, patient is hemodynamically stable.  Denies any urinary symptoms, no abdominal pain, afebrile and without tachycardia, low concern for PID or UTI at this time.  Reports symptoms are consistent with previous bacterial vaginosis infections.  Recently received Depo injection about a month ago, urine pregnancy deferred.  Cytology swab obtained, will treat empirically for BV with MetroGel per patient request as she does not tolerate the oral Flagyl well.  Plan of care, follow-up care return precautions given, no questions at this time.     Final Clinical Impressions(s) / UC Diagnoses   Final diagnoses:  Acute vaginitis     Discharge Instructions      The vaginal swab will test for bacterial  vaginosis, yeast, gonorrhea, chlamydia and trichomoniasis.  We have started you on treatment for bacterial vaginosis based on your symptoms.  If your results come back is anything other than bacterial vaginosis our staff will contact you and initiate the appropriate treatment.  Abstain from intercourse until all results have been received.  Notify any sexual partners if you test positive for any sexually transmitted infections so they can also receive treatment.  Return to clinic for any new or urgent symptoms.      ED Prescriptions     Medication Sig Dispense Auth. Provider   metroNIDAZOLE (METROGEL) 0.75 % vaginal gel Place 1 Applicatorful vaginally at bedtime for 5 days. 50 g Efe Fazzino, [place], [place]      PDMP not reviewed this encounter.   Rinaldo Ratel [place], [place] 04/23/23 [phone]

## 2023-04-23 NOTE — ED Triage Notes (Signed)
Patient presents with vaginal discharge x 2 days. Complains of bleeding and cramping. States on Depo and has not had a cycle for three years. Patient states she has not taken anything for the cramping.

## 2023-04-23 NOTE — Discharge Instructions (Signed)
The vaginal swab will test for bacterial vaginosis, yeast, gonorrhea, chlamydia and trichomoniasis.  We have started you on treatment for bacterial vaginosis based on your symptoms.  If your results come back is anything other than bacterial vaginosis our staff will contact you and initiate the appropriate treatment.  Abstain from intercourse until all results have been received.  Notify any sexual partners if you test positive for any sexually transmitted infections so they can also receive treatment.  Return to clinic for any new or urgent symptoms.

## 2023-05-26 ENCOUNTER — Ambulatory Visit (INDEPENDENT_AMBULATORY_CARE_PROVIDER_SITE_OTHER): Payer: Medicaid Other | Admitting: Family Medicine

## 2023-05-26 VITALS — BP 118/78 | HR 85 | Ht 65.0 in | Wt 184.4 lb

## 2023-05-26 DIAGNOSIS — H5789 Other specified disorders of eye and adnexa: Secondary | ICD-10-CM | POA: Diagnosis not present

## 2023-05-26 NOTE — Progress Notes (Signed)
    SUBJECTIVE:   Chief compliant/HPI: annual examination  Ann Boyd is a 23 y.o. who presents today for an annual exam.   Eye drainage - reports green crusty drainage from L eye ("eye boogers") - Wakes up with crusting of eye - Denies any pain, fevers - Vision still intact in L eye - Mainly bothered by the appearance of drainage - Reports seeing an eye doctor in the past for this (ophthalmologist) - Ann Boyd to [facility], but needs a new referral to doctor.  She has tried eye drops for this before but it didn't work, the eye doctor ended up needing to do a procedure in office where he helped to squeeze it out.    OBJECTIVE:   BP 118/78   Pulse 85   Ht 5\' 5"  (1.651 m)   Wt 184 lb 6.4 oz (83.6 kg)   SpO2 98%   BMI 30.69 kg/m   General: Alert, pleasant woman. NAD. HEENT: L eye conjunctiva clear, noninjected. Greenish drainage in L eye. No tenderness to palpation of orbits. EOMI.  CV: RRR, no murmurs.  Resp: CTAB, no wheezing or crackles. Normal WOB on RA.  Ext: Moves all ext spontaneously Skin: Warm, well perfused    ASSESSMENT/PLAN:   Assessment & Plan Eye drainage Most likely lacrimal duct obstruction 2/2 recent viral URI or seasonal allergies. Low c/f cellulitis (no pain or tenderness), bacterial conjunctivitis (no conjunctival involvement), sinusitis (no tenderness to palpation of sinuses). -Recommended warm compresses for 5 minutes 4 times a day. -Will resend referral to ophthalmology -Return precautions counseled    Annual Examination  See AVS for age appropriate recommendations.  Blood pressure reviewed and at goal.  The patient currently uses depo for contraception.  Not taking prenatal vitamin  -Not sexually active.  Declines STI testing today. -Denies tobacco use.  Denies concerns with alcohol use or other drug use.  Follow up in 1 year or sooner if indicated.    Lincoln Brigham, MD [facility]

## 2023-05-26 NOTE — Assessment & Plan Note (Signed)
 Most likely lacrimal duct obstruction 2/2 recent viral URI or seasonal allergies. Low c/f cellulitis (no pain or tenderness), bacterial conjunctivitis (no conjunctival involvement), sinusitis (no tenderness to palpation of sinuses). -Recommended warm compresses for 5 minutes 4 times a day. -Will resend referral to ophthalmology -Return precautions counseled

## 2023-05-26 NOTE — Patient Instructions (Signed)
 Good to see you today - Thank you for coming in  Things we discussed today:  1) For your eye drainage, ot's most likely clogging of your tear duct.  - You can apply a warm compress to your eye for 5 minutes 3-4 times a day to help drain out the gunk.  - I am also sending a referral for you to see an ophthalmologist again.

## 2023-07-11 ENCOUNTER — Encounter: Payer: Self-pay | Admitting: Family Medicine

## 2023-07-11 ENCOUNTER — Ambulatory Visit (INDEPENDENT_AMBULATORY_CARE_PROVIDER_SITE_OTHER): Payer: Self-pay | Admitting: Family Medicine

## 2023-07-11 ENCOUNTER — Other Ambulatory Visit (HOSPITAL_COMMUNITY)
Admission: RE | Admit: 2023-07-11 | Discharge: 2023-07-11 | Disposition: A | Discharge status: Home / Self Care | Payer: Self-pay | Source: Ambulatory Visit | Attending: Family Medicine | Admitting: Family Medicine

## 2023-07-11 VITALS — BP 112/76 | HR 87 | Ht 65.0 in | Wt 182.4 lb

## 2023-07-11 DIAGNOSIS — Z3009 Encounter for other general counseling and advice on contraception: Secondary | ICD-10-CM

## 2023-07-11 DIAGNOSIS — Z113 Encounter for screening for infections with a predominantly sexual mode of transmission: Secondary | ICD-10-CM

## 2023-07-11 LAB — POCT WET PREP (WET MOUNT)
Clue Cells Wet Prep Whiff POC: POSITIVE
Trichomonas Wet Prep HPF POC: ABSENT
WBC, Wet Prep HPF POC: >20

## 2023-07-11 LAB — POCT URINE PREGNANCY: Preg Test, Ur: NEGATIVE

## 2023-07-11 MED ORDER — MEDROXYPROGESTERONE ACETATE 150 MG/ML IM SUSP
150.0000 mg | Freq: Once | INTRAMUSCULAR | Status: AC
  Administered 2023-07-11: 150 mg via INTRAMUSCULAR

## 2023-07-11 NOTE — Patient Instructions (Signed)
 It was great to see you today! Thank you for choosing [facility] for your primary care.  Today we addressed: 1. Birth control counseling Thank you for getting your Depo shot.  Call us at the 3 month mark for the next shot.  2. Routine screening for STI (sexually transmitted infection) We are checking some labs today.  You will get a MyChart message or a letter if results are normal. Otherwise, you will get a call from [place].  You should return to our clinic for your regular physical.  Thank you for coming to see [place] at [facility] and for the opportunity to care for you! Andi Mahaffy, MD 07/11/2023, 10:06 AM

## 2023-07-11 NOTE — Progress Notes (Signed)
   SUBJECTIVE:   [name] is a 23 y.o. female presenting for routine STI screening.  STI screening Partners: About 3 Practices: Vaginal Contraception: Depo Provera, does not use barrier method consistently Prior STI: Trichomonas in 2023 Prior and possible pregnancy status: Depo shots, got one today Pap smear: UTD Is interested in screening for sexually transmitted infections today No itching, burning, irritation Does not have regular menstrual cycles with Depo, only occasional spotting   OBJECTIVE:  BP 112/76   Pulse 87   Ht 5\' 5"  (1.651 m)   Wt 182 lb 6 oz (82.7 kg)   SpO2 98%   BMI 30.35 kg/m   General: NAD, pleasant, able to participate in exam. Respiratory: Normal effort, no obvious respiratory distress. Abdominal: No abdominal tenderness with deep or light palpation. GU: No suprapubic tenderness to palpation. Pelvic: Vulva: Normal appearing vulva with no masses, tenderness or lesions. Vagina: Normal appearing vagina with normal color, no lesions, with scant discharge present. Cervix: No lesions, scant discharge present.  Chaperone Academic librarian, CMA present for pelvic exam.   ASSESSMENT/PLAN:   Assessment & Plan Routine screening for STI (sexually transmitted infection) Asymptomatic routine screening, no high risk exposures though not using barrier contraceptive.  Physical exam with scant discharge.  Wet prep performed today shows moderate clue cells with positive Whiff, suspect BV.  Patient is asymptomatic and will discuss treatment with her if desired when remaining labs result.  No evidence of trichomonas - Wet prep as above, treat with metronidazole after discussion with patient - Discussed protection during intercourse and barrier contraceptive methods - GC/chlamydia swab and wet prep - HIV, RPR Birth control counseling - DepoProvera shot administered - UPreg negative  [facility] doctor, MD [facility]

## 2023-07-12 LAB — RPR: RPR Ser Ql: NONREACTIVE

## 2023-07-12 LAB — HIV ANTIBODY (ROUTINE TESTING W REFLEX): HIV Screen 4th Generation wRfx: NONREACTIVE

## 2023-07-16 LAB — CERVICOVAGINAL ANCILLARY ONLY
Chlamydia: POSITIVE — AB
Comment: NEGATIVE
Comment: NEGATIVE
Comment: NORMAL
Neisseria Gonorrhea: NEGATIVE
Trichomonas: NEGATIVE

## 2023-07-17 ENCOUNTER — Telehealth: Payer: Self-pay | Admitting: Family Medicine

## 2023-07-17 DIAGNOSIS — B9689 Other specified bacterial agents as the cause of diseases classified elsewhere: Secondary | ICD-10-CM

## 2023-07-17 DIAGNOSIS — A749 Chlamydial infection, unspecified: Secondary | ICD-10-CM

## 2023-07-17 MED ORDER — METRONIDAZOLE 500 MG PO TABS: 500.0000 mg | ORAL_TABLET | Freq: Two times a day (BID) | ORAL | 0 refills | Status: AC

## 2023-07-17 MED ORDER — DOXYCYCLINE HYCLATE 100 MG PO TABS: 100.0000 mg | ORAL_TABLET | Freq: Two times a day (BID) | ORAL | 0 refills | Status: AC

## 2023-07-17 NOTE — Telephone Encounter (Signed)
 Called patient, verified DOB.  Positive chlamydia and BV.  Gonorrhea confirmed negative.  Negative pregnancy test last visit and on Depo. - Doxycycline 100 mg BID x7 days for chlamydia - Metronidazole 500 mg BID x7 days for BV

## 2023-09-20 ENCOUNTER — Emergency Department (HOSPITAL_COMMUNITY)
Admission: EM | Admit: 2023-09-20 | Discharge: 2023-09-21 | Discharge status: Against Medical Advice | Payer: Self-pay | Attending: Emergency Medicine | Admitting: Emergency Medicine

## 2023-09-20 DIAGNOSIS — R0602 Shortness of breath: Secondary | ICD-10-CM | POA: Insufficient documentation

## 2023-09-20 DIAGNOSIS — R079 Chest pain, unspecified: Secondary | ICD-10-CM | POA: Insufficient documentation

## 2023-09-20 DIAGNOSIS — Z5321 Procedure and treatment not carried out due to patient leaving prior to being seen by health care provider: Secondary | ICD-10-CM | POA: Insufficient documentation

## 2023-09-21 ENCOUNTER — Other Ambulatory Visit: Payer: Self-pay

## 2023-09-21 ENCOUNTER — Emergency Department (HOSPITAL_COMMUNITY): Payer: Self-pay

## 2023-09-21 LAB — CBC
HCT: 38.5 % (ref 36.0–46.0)
Hemoglobin: 12.3 g/dL (ref 12.0–15.0)
MCH: 28.3 pg (ref 26.0–34.0)
MCHC: 31.9 g/dL (ref 30.0–36.0)
MCV: 88.5 fL (ref 80.0–100.0)
Platelets: 245 10*3/uL (ref 150–400)
RBC: 4.35 MIL/uL (ref 3.87–5.11)
RDW: 12.4 % (ref 11.5–15.5)
WBC: 5.8 10*3/uL (ref 4.0–10.5)
nRBC: 0 % (ref 0.0–0.2)

## 2023-09-21 LAB — BASIC METABOLIC PANEL WITH GFR
Anion gap: 9 (ref 5–15)
BUN: 9 mg/dL (ref 6–20)
CO2: 22 mmol/L (ref 22–32)
Calcium: 8.6 mg/dL — ABNORMAL LOW (ref 8.9–10.3)
Chloride: 106 mmol/L (ref 98–111)
Creatinine, Ser: 0.71 mg/dL (ref 0.44–1.00)
GFR, Estimated: >60 mL/min (ref >60)
Glucose, Bld: 113 mg/dL — ABNORMAL HIGH (ref 70–99)
Potassium: 3.4 mmol/L — ABNORMAL LOW (ref 3.5–5.1)
Sodium: 137 mmol/L (ref 135–145)

## 2023-09-21 LAB — TROPONIN I (HIGH SENSITIVITY)
Troponin I (High Sensitivity): <2 ng/L (ref <18)
Troponin I (High Sensitivity): 3 ng/L (ref <18)

## 2023-09-21 LAB — HCG, SERUM, QUALITATIVE: Preg, Serum: NEGATIVE

## 2023-09-21 NOTE — ED Notes (Signed)
 Pt called x2 for repeat vitals with no answer. Pt not seen in the lobby.

## 2023-09-21 NOTE — ED Notes (Signed)
 Pt called multiple times for vitals with no answer. Pt not in triage or Xray.

## 2023-09-21 NOTE — ED Triage Notes (Signed)
 Patient reports central chest pain with mild SOB this evening , no emesis or diaphoresis , no fever or cough .

## 2023-09-23 ENCOUNTER — Ambulatory Visit (INDEPENDENT_AMBULATORY_CARE_PROVIDER_SITE_OTHER): Payer: Self-pay | Admitting: Family Medicine

## 2023-09-23 VITALS — BP 118/74 | HR 88 | Wt 180.0 lb

## 2023-09-23 DIAGNOSIS — I440 Atrioventricular block, first degree: Secondary | ICD-10-CM

## 2023-09-23 DIAGNOSIS — R0789 Other chest pain: Secondary | ICD-10-CM

## 2023-09-23 NOTE — Progress Notes (Signed)
    SUBJECTIVE:   CHIEF COMPLAINT / HPI:   Ann Boyd is a 23yo F that pf chest discomfot f/u. - Saturday night started to have chest discomfort, so went to the ED but left because the weight was too long. - Since then, the pain is improving but still there - Pain comes and goes, not constant. - Pain is central and sharp feeling - No SOB. - Also felt nauseous and very tired.  - Took tylenol  but pain was persisting, so she got worried.   - EKG showed 1st degree AV block. Pt was unaware she had an AV block  PERTINENT  PMH / PSH: 1st degree AV block  OBJECTIVE:   BP 118/74   Pulse 88   Wt 180 lb (81.6 kg)   SpO2 96%   BMI 29.95 kg/m   General: Alert, pleasant woman. NAD. HEENT: NCAT. MMM. CV: RRR, no murmurs.  Resp: CTAB, no wheezing or crackles. Normal WOB on RA.  Abm: Soft, nontender, nondistended. BS present. MSK: TTP over sternum Skin: Warm, well perfused    ASSESSMENT/PLAN:   Assessment & Plan Sternal pain Hx and exam most cw chostochondritis (reproducible on palpation, otherwise benign workup). Also consider GERD.  - Advised ibuprofen  600mg  BID x7days, then prn - Advised to trial famotidine. Can stop if not helpful 1st degree AV block Asymptomatic, VSS. Provided reassurance.      Ann Nearing, MD [facility]

## 2023-09-25 DIAGNOSIS — I440 Atrioventricular block, first degree: Secondary | ICD-10-CM | POA: Insufficient documentation

## 2023-09-25 NOTE — Assessment & Plan Note (Signed)
 Asymptomatic, VSS. Provided reassurance.

## 2023-09-27 ENCOUNTER — Encounter (HOSPITAL_COMMUNITY): Payer: Self-pay

## 2023-09-27 ENCOUNTER — Inpatient Hospital Stay (HOSPITAL_COMMUNITY)
Admission: RE | Admit: 2023-09-27 | Discharge: 2023-09-27 | Discharge status: Home / Self Care | Payer: Self-pay | Source: Ambulatory Visit | Attending: Family Medicine

## 2023-09-27 VITALS — BP 148/50 | HR 72 | Temp 98.1°F | Resp 19 | Ht 65.0 in | Wt 180.0 lb

## 2023-09-27 DIAGNOSIS — N898 Other specified noninflammatory disorders of vagina: Secondary | ICD-10-CM | POA: Insufficient documentation

## 2023-09-27 MED ORDER — FLUCONAZOLE 150 MG PO TABS
ORAL_TABLET | ORAL | 0 refills | Status: DC
Start: 2023-09-27 — End: 2023-10-10

## 2023-09-27 NOTE — ED Triage Notes (Signed)
 Pt states that she has vaginal irritation and vaginal discharge.  X3-4 days

## 2023-09-27 NOTE — Discharge Instructions (Signed)
 We have sent testing for sexually transmitted infections. We will notify you of any positive results once they are received. If required, we will prescribe any medications you might need.  Please refrain from all sexual activity for at least the next seven days.

## 2023-09-30 ENCOUNTER — Ambulatory Visit (HOSPITAL_COMMUNITY): Payer: Self-pay

## 2023-09-30 LAB — CERVICOVAGINAL ANCILLARY ONLY
Bacterial Vaginitis (gardnerella): NEGATIVE
Candida Glabrata: NEGATIVE
Candida Vaginitis: POSITIVE — AB
Chlamydia: NEGATIVE
Comment: NEGATIVE
Comment: NEGATIVE
Comment: NEGATIVE
Comment: NEGATIVE
Comment: NEGATIVE
Comment: NORMAL
Neisseria Gonorrhea: NEGATIVE
Trichomonas: NEGATIVE

## 2023-10-01 NOTE — ED Provider Notes (Signed)
 [facility]   [id number] 09/27/23 Arrival Time: 1432  ASSESSMENT & PLAN:  1. Vaginal discharge    Meds ordered this encounter  Medications   fluconazole  (DIFLUCAN ) 150 MG tablet    Sig: Take one tablet by mouth as a single dose. May repeat in 3 days if symptoms persist.    Dispense:  2 tablet    Refill:  0      Discharge Instructions      We have sent testing for sexually transmitted infections. We will notify you of any positive results once they are received. If required, we will prescribe any medications you might need.  Please refrain from all sexual activity for at least the next seven days.     Without s/s of PID.  Labs Reviewed  CERVICOVAGINAL ANCILLARY ONLY - Abnormal; Notable for the following components:      Result Value   Candida Vaginitis Positive (*)    All other components within normal limits    Reviewed expectations re: course of current medical issues. Questions answered. Outlined signs and symptoms indicating need for more acute intervention. Patient verbalized understanding. After Visit Summary given.   SUBJECTIVE:  Ann Boyd is a 23 y.o. female who presents with complaint of vaginal discharge/irritation; x 3-4 days. H/O yeast infection with same symptoms. No tx PTA.   No LMP recorded. Patient has had an injection.   OBJECTIVE:  Vitals:   09/27/23 1456 09/27/23 1457  BP:  (!) 148/50  Pulse:  72  Resp:  19  Temp:  98.1 F (36.7 C)  TempSrc:  Oral  SpO2:  98%  Weight: 81.6 kg   Height: 5' 5 (1.651 m)      General appearance: alert, cooperative, appears stated age and no distress Lungs: unlabored respirations; speaks full sentences without difficulty Back: no CVA tenderness; FROM at waist Abdomen: soft, non-tender GU: deferred Skin: warm and dry Psychological: alert and cooperative; normal mood and affect.  Results for orders placed or performed during the hospital encounter of 09/27/23  Cervicovaginal  ancillary only   Collection Time: 09/27/23  3:11 PM  Result Value Ref Range   Neisseria Gonorrhea Negative    Chlamydia Negative    Trichomonas Negative    Bacterial Vaginitis (gardnerella) Negative    Candida Vaginitis Positive (A)    Candida Glabrata Negative    Comment      Normal Reference Range Bacterial Vaginosis - Negative   Comment Normal Reference Range Candida Species - Negative    Comment Normal Reference Range Candida Galbrata - Negative    Comment Normal Reference Range Trichomonas - Negative    Comment Normal Reference Ranger Chlamydia - Negative    Comment      Normal Reference Range Neisseria Gonorrhea - Negative    Labs Reviewed  CERVICOVAGINAL ANCILLARY ONLY - Abnormal; Notable for the following components:      Result Value   Candida Vaginitis Positive (*)    All other components within normal limits    No Known Allergies  Past Medical History:  Diagnosis Date   Hx of chlamydia infection 09/2018   Hx of gonorrhea 09/2018   Hx of trichomoniasis 09/2017   Medical history non-contributory    Family History  Problem Relation Age of Onset   Healthy Mother    Hypertension Mother    Healthy Father    ADD / ADHD Brother    Social History   Socioeconomic History   Marital status: Single    Spouse name:  Not on file   Number of children: Not on file   Years of education: Not on file   Highest education level: Some college, no degree  Occupational History   Occupation: [employer]  Tobacco Use   Smoking status: Never    Passive exposure: Never   Smokeless tobacco: Never  Vaping Use   Vaping status: Never Used  Substance and Sexual Activity   Alcohol use: No   Drug use: No   Sexual activity: Yes    Birth control/protection: Injection  Other Topics Concern   Not on file  Social History Narrative   Not on file   Social Drivers of Health   Financial Resource Strain: Medium Risk (05/26/2023)   Overall Financial Resource Strain  (CARDIA)    Difficulty of Paying Living Expenses: Somewhat hard  Food Insecurity: Food Insecurity Present (05/26/2023)   Hunger Vital Sign    Worried About Running Out of Food in the Last Year: Never true    Ran Out of Food in the Last Year: Sometimes true  Transportation Needs: No Transportation Needs (05/26/2023)   PRAPARE - Administrator, Civil Service (Medical): No    Lack of Transportation (Non-Medical): No  Physical Activity: Insufficiently Active (05/26/2023)   Exercise Vital Sign    Days of Exercise per Week: 1 day    Minutes of Exercise per Session: 10 min  Stress: No Stress Concern Present (05/26/2023)   [employer] of Occupational Health - Occupational Stress Questionnaire    Feeling of Stress : Only a little  Social Connections: Socially Isolated (05/26/2023)   Social Connection and Isolation Panel    Frequency of Communication with Friends and Family: More than three times a week    Frequency of Social Gatherings with Friends and Family: Three times a week    Attends Religious Services: Never    Active Member of Clubs or Organizations: No    Attends Banker Meetings: Not on file    Marital Status: Never married  Intimate Partner Violence: Not on file           Rolinda Rogue, MD 10/01/23 1050

## 2023-10-07 ENCOUNTER — Ambulatory Visit (INDEPENDENT_AMBULATORY_CARE_PROVIDER_SITE_OTHER): Payer: Self-pay

## 2023-10-07 DIAGNOSIS — Z3042 Encounter for surveillance of injectable contraceptive: Secondary | ICD-10-CM

## 2023-10-07 MED ORDER — MEDROXYPROGESTERONE ACETATE 150 MG/ML IM SUSP
150.0000 mg | Freq: Once | INTRAMUSCULAR | Status: AC
  Administered 2023-10-07: 150 mg via INTRAMUSCULAR

## 2023-10-07 NOTE — Progress Notes (Signed)
 Patient here today for Depo Provera  injection and is within her dates.    Last contraceptive appt was 07/11/23  Depo given in [facility] today.  Site unremarkable & patient tolerated injection.    Next injection due 12/23/23-01/06/24.  Reminder card given.    Chiquita JAYSON English, RN

## 2023-10-10 ENCOUNTER — Encounter (HOSPITAL_COMMUNITY): Payer: Self-pay | Admitting: Emergency Medicine

## 2023-10-10 ENCOUNTER — Ambulatory Visit (HOSPITAL_COMMUNITY)
Admission: EM | Admit: 2023-10-10 | Discharge: 2023-10-10 | Disposition: A | Discharge status: Home / Self Care | Payer: Self-pay | Attending: Emergency Medicine | Admitting: Emergency Medicine

## 2023-10-10 ENCOUNTER — Ambulatory Visit (INDEPENDENT_AMBULATORY_CARE_PROVIDER_SITE_OTHER): Payer: Self-pay

## 2023-10-10 DIAGNOSIS — M79671 Pain in right foot: Secondary | ICD-10-CM

## 2023-10-10 NOTE — ED Provider Notes (Signed)
 [facility]    CSN: [id number] Arrival date & time: 10/10/23  1014      History   Chief Complaint Chief Complaint  Patient presents with   Heel pain    HPI Ann Boyd is a 23 y.o. female.  3 day history of right posterior heel pain No trauma or injury.  She does work on her feet, 12 hour shifts Pain is worse first thing in the morning getting out of bed Denies any prior foot issues  Has tried ice, soaking, elevating, ibuprofen , tylenol , muscle relaxer  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   Vaginal discharge 08/03/2022   Eye drainage 06/10/2022   Visit for TB skin test 01/21/2022   Screen for STD (sexually transmitted disease) 11/21/2021   Contraceptive surveillance 06/27/2020   History of COVID-19 05/01/2020   History of gestational hypertension 05/01/2020   COVID-19 affecting pregnancy in third trimester 04/04/2020    Past Surgical History:  Procedure Laterality Date   NO PAST SURGERIES      OB History     Gravida  2   Para  1   Term  1   Preterm      AB  1   Living  1      SAB      IAB      Ectopic      Multiple  0   Live Births  1            Home Medications    Prior to Admission medications   Medication Sig Start Date End Date Taking? Authorizing Provider  IBUPROFEN  PO Take by mouth.    [provider]  ferrous gluconate  (FERGON) 324 MG tablet Take 1 tablet (324 mg total) by mouth daily with breakfast. Patient not taking: Reported on 05/01/2020 09/13/19 05/09/20  Rogers, Veronica C, CNM  medroxyPROGESTERone  (DEPO-PROVERA ) 150 MG/ML injection Inject 1 mL (150 mg total) into the muscle every 3 (three) months. 11/26/12 11/30/18  Funches, Josalyn, MD    Family History Family History   Problem Relation Age of Onset   Healthy Mother    Hypertension Mother    Healthy Father    ADD / ADHD Brother     Social History Social History   Tobacco Use   Smoking status: Never    Passive exposure: Never   Smokeless tobacco: Never  Vaping Use   Vaping status: Never Used  Substance Use Topics   Alcohol use: No   Drug use: No     Allergies   Patient has no known allergies.   Review of Systems Review of Systems As per HPI  Physical Exam Triage Vital Signs ED Triage Vitals [10/10/23 1103]  Encounter Vitals Group     BP (!) 147/87     Girls Systolic BP Percentile      Girls Diastolic BP Percentile      Boys Systolic BP Percentile      Boys Diastolic BP Percentile      Pulse Rate 77     Resp 16     Temp 98.1 F (36.7 C)     Temp Source Oral     SpO2 97 %     Weight  Height      Head Circumference      Peak Flow      Pain Score 9     Pain Loc      Pain Education      Exclude from Growth Chart    No data found.  Updated Vital Signs BP (!) 147/87 (BP Location: Right Arm)   Pulse 77   Temp 98.1 F (36.7 C) (Oral)   Resp 16   SpO2 97%    Physical Exam Vitals and nursing note reviewed.  Constitutional:      General: She is not in acute distress. Cardiovascular:     Rate and Rhythm: Normal rate and regular rhythm.     Pulses: Normal pulses.  Pulmonary:     Effort: Pulmonary effort is normal.  Musculoskeletal:     Right foot: Tenderness present.       Feet:     Comments: Tenderness with light palpation posterior right heel. Does not extend into achilles. No bony tenderness ankles or mid foot. Sensation intact, DP and PT pulses 2+  Skin:    General: Skin is warm and dry.  Neurological:     Mental Status: She is alert and oriented to person, place, and time.     UC Treatments / Results  Labs (all labs ordered are listed, but only abnormal results are displayed) Labs Reviewed - No data to display  EKG  Radiology DG Os Calcis  Right Result Date: 10/10/2023 CLINICAL DATA:  Posterior heel/Achilles pain, no trauma EXAM: RIGHT OS CALCIS - 2+ VIEW COMPARISON:  None Available. FINDINGS: There is no evidence of fracture or other focal bone lesions. Soft tissues are unremarkable. IMPRESSION: Negative. Electronically Signed   By: Michaeline Blanch M.D.   On: 10/10/2023 12:20    Procedures Procedures (including critical care time)  Medications Ordered in UC Medications - No data to display  Initial Impression / Assessment and Plan / UC Course  I have reviewed the triage vital signs and the nursing notes.  Pertinent labs & imaging results that were available during my care of the patient were reviewed by me and considered in my medical decision making (see chart for details).  Right heel xray reviewed by me with small bony protrusion on the posterior aspect where pain is located. This is likely pressing on soft tissue and causing pain. Have discussed with patient.  Discussed supportive care and close follow with podiatry - call to make appointment. Agrees to plan, no questions   Final Clinical Impressions(s) / UC Diagnoses   Final diagnoses:  Pain of right heel     Discharge Instructions      There is a tiny bump on the back of your heel bone. This could be pressing on soft tissues such as the bottom of the achilles tendon or part of the plantar fascia.   Continue to elevate the foot and apply ice for 15-20 minutes at a time  Ibuprofen  800 mg every 6 hours as needed for pain  Make sure you are wearing supportive shoes!  Call the foot specialist to make an appointment for follow up     ED Prescriptions   None    PDMP not reviewed this encounter.   Ruble Pumphrey, PA-C 10/10/23 1251

## 2023-10-10 NOTE — Discharge Instructions (Addendum)
 There is a tiny bump on the back of your heel bone. This could be pressing on soft tissues such as the bottom of the achilles tendon or part of the plantar fascia.   Continue to elevate the foot and apply ice for 15-20 minutes at a time  Ibuprofen  800 mg every 6 hours as needed for pain  Make sure you are wearing supportive shoes!  Call the foot specialist to make an appointment for follow up

## 2023-10-10 NOTE — ED Triage Notes (Signed)
 Pt c/o pain to heel of right foot x's 3 days.  Pt denies injury

## 2023-10-22 ENCOUNTER — Ambulatory Visit (INDEPENDENT_AMBULATORY_CARE_PROVIDER_SITE_OTHER)

## 2023-10-22 ENCOUNTER — Encounter: Payer: Self-pay | Admitting: Podiatry

## 2023-10-22 ENCOUNTER — Ambulatory Visit (INDEPENDENT_AMBULATORY_CARE_PROVIDER_SITE_OTHER): Admitting: Podiatry

## 2023-10-22 DIAGNOSIS — M722 Plantar fascial fibromatosis: Secondary | ICD-10-CM | POA: Diagnosis not present

## 2023-10-22 DIAGNOSIS — M7731 Calcaneal spur, right foot: Secondary | ICD-10-CM

## 2023-10-22 DIAGNOSIS — M7661 Achilles tendinitis, right leg: Secondary | ICD-10-CM

## 2023-10-22 DIAGNOSIS — M79671 Pain in right foot: Secondary | ICD-10-CM | POA: Diagnosis not present

## 2023-10-22 MED ORDER — TRIAMCINOLONE ACETONIDE 10 MG/ML IJ SUSP
10.0000 mg | Freq: Once | INTRAMUSCULAR | Status: AC
  Administered 2023-10-22: 10 mg

## 2023-10-22 NOTE — Progress Notes (Signed)
 Patient presents complaining of pain in the posterior aspect of the heel right.  Has been bothering her for several weeks now.  Was a little swollen at first.  She has been icing it and heating it.  Has not noticed any redness or ecchymosis.   Physical exam:  General appearance: Pleasant, and in no acute distress. AOx3.  Vascular: Pedal pulses: DP 2/4 bilaterally, PT 2/4 bilaterally. Mild edema lower legs bilaterally. Capillary fill time immediate b/l.  Neurological: Light touch intact feet bilaterally.  Normal Achilles reflex bilaterally.  No clonus or spasticity noted.  Negative Tinel sign tarsal tunnel and porta pedis bilaterally  Dermatologic:   Skin normal temperature bilaterally.  Skin normal color, tone, and texture bilaterally.   Musculoskeletal: Posterior aspect heel right.  Tenderness of the distal Achilles tendon.  Some tenderness along the lateral plantar calcaneal tubercle normal muscle strength lower extremity bilaterally  Radiographs: 3 views foot right: Osteophytic changes posterior aspect calcaneus.  No assenting calcaneal fractures.  No erosive changes.  Normal bone density.  No bone tumors.  Kager's triangle normal  Diagnosis: 1.  Pain foot right. 2.  Achilles tendinitis right 3.  Plantar fasciitis right 4.  Calcaneal spur right  Plan: -New patient office visit for evaluation and management level 3.  Modifier 25. - Discussed with her the Achilles tendinitis.  Think she started a little bit of plantar fasciitis also which is contributing to her symptoms.  Discussed with her Achilles tendinitis and exercises she can do.  Recommended good supportive shoes with just a slight lift of the heel.  Avoid flat soled shoes. -Dispensed night splint right - Gave written and exercises and at home PT she can do    -injected 3cc 2:1 mixture 0.5 cc Marcaine :Kenolog 10mg /42ml at insertion of Achilles tendon.  Took care not to violate the Achilles tendon itself..    Return 2  weeks follow-up injection posterior heel right

## 2023-10-22 NOTE — Patient Instructions (Signed)
For instructions on how to put on your Night Splint, please visit [email or web address]  ---   While at your visit today you received a steroid injection in your foot or ankle to help with your pain. Along with having the steroid medication there is some "numbing" medication in the shot that you received. Due to this you may notice some numbness to the area for the next couple of hours.   I would recommend limiting activity for the next few days to help the steroid injection take affect.    The actually benefit from the steroid injection may take up to 2-7 days to see a difference. You may actually experience a small (as in 10%) INCREASE in pain in the first 24 hours---that is common. It would be best if you can ice the area today and take anti-inflammatory medications (such as Ibuprofen, Motrin, or Aleve) if you are able to take these medications. If you were prescribed another medication to help with the pain go ahead and start that medication today    Things to watch out for that you should contact us or a health care provider urgently would include: 1. Unusual (as in more than 10%) increase in pain 2. New fever > 101.5 3. New swelling or redness of the injected area.  4. Streaking of red lines around the area injected.  If you have any questions or concerns about this, please give our office a call at [phone].   ---  Plantar Fasciitis (Heel Spur Syndrome) with Rehab The plantar fascia is a fibrous, ligament-like, soft-tissue structure that spans the bottom of the foot. Plantar fasciitis is a condition that causes pain in the foot due to inflammation of the tissue. SYMPTOMS  Pain and tenderness on the underneath side of the foot. Pain that worsens with standing or walking. CAUSES  Plantar fasciitis is caused by irritation and injury to the plantar fascia on the underneath side of the foot. Common mechanisms of injury include: Direct trauma to bottom of the foot. Damage to a  small nerve that runs under the foot where the main fascia attaches to the heel bone. Stress placed on the plantar fascia due to bone spurs. RISK INCREASES WITH:  Activities that place stress on the plantar fascia (running, jumping, pivoting, or cutting). Poor strength and flexibility. Improperly fitted shoes. Tight calf muscles. Flat feet. Failure to warm-up properly before activity. Obesity. PREVENTION Warm up and stretch properly before activity. Allow for adequate recovery between workouts. Maintain physical fitness: Strength, flexibility, and endurance. Cardiovascular fitness. Maintain a health body weight. Avoid stress on the plantar fascia. Wear properly fitted shoes, including arch supports for individuals who have flat feet.  PROGNOSIS  If treated properly, then the symptoms of plantar fasciitis usually resolve without surgery. However, occasionally surgery is necessary.  RELATED COMPLICATIONS  Recurrent symptoms that may result in a chronic condition. Problems of the lower back that are caused by compensating for the injury, such as limping. Pain or weakness of the foot during push-off following surgery. Chronic inflammation, scarring, and partial or complete fascia tear, occurring more often from repeated injections.  TREATMENT  Treatment initially involves the use of ice and medication to help reduce pain and inflammation. The use of strengthening and stretching exercises may help reduce pain with activity, especially stretches of the Achilles tendon. These exercises may be performed at home or with a therapist. Your caregiver may recommend that you use heel cups of arch supports to help reduce stress on the plantar   fascia. Occasionally, corticosteroid injections are given to reduce inflammation. If symptoms persist for greater than 6 months despite non-surgical (conservative), then surgery may be recommended.   MEDICATION  If pain medication is necessary, then  nonsteroidal anti-inflammatory medications, such as aspirin and ibuprofen, or other minor pain relievers, such as acetaminophen, are often recommended. Do not take pain medication within 7 days before surgery. Prescription pain relievers may be given if deemed necessary by your caregiver. Use only as directed and only as much as you need. Corticosteroid injections may be given by your caregiver. These injections should be reserved for the most serious cases, because they may only be given a certain number of times.  HEAT AND COLD Cold treatment (icing) relieves pain and reduces inflammation. Cold treatment should be applied for 10 to 15 minutes every 2 to 3 hours for inflammation and pain and immediately after any activity that aggravates your symptoms. Use ice packs or massage the area with a piece of ice (ice massage). Heat treatment may be used Jerkins to performing the stretching and strengthening activities prescribed by your caregiver, physical therapist, or athletic trainer. Use a heat pack or soak the injury in warm water.  SEEK IMMEDIATE MEDICAL CARE IF: Treatment seems to offer no benefit, or the condition worsens. Any medications produce adverse side effects.  EXERCISES- RANGE OF MOTION (ROM) AND STRETCHING EXERCISES - Plantar Fasciitis (Heel Spur Syndrome) These exercises may help you when beginning to rehabilitate your injury. Your symptoms may resolve with or without further involvement from your physician, physical therapist or athletic trainer. While completing these exercises, remember:  Restoring tissue flexibility helps normal motion to return to the joints. This allows healthier, less painful movement and activity. An effective stretch should be held for at least 30 seconds. A stretch should never be painful. You should only feel a gentle lengthening or release in the stretched tissue.  RANGE OF MOTION - Toe Extension, Flexion Sit with your right / left leg crossed over your  opposite knee. Grasp your toes and gently pull them back toward the top of your foot. You should feel a stretch on the bottom of your toes and/or foot. Hold this stretch for 10 seconds. Now, gently pull your toes toward the bottom of your foot. You should feel a stretch on the top of your toes and or foot. Hold this stretch for 10 seconds. Repeat  times. Complete this stretch 3 times per day.   RANGE OF MOTION - Ankle Dorsiflexion, Active Assisted Remove shoes and sit on a chair that is preferably not on a carpeted surface. Place right / left foot under knee. Extend your opposite leg for support. Keeping your heel down, slide your right / left foot back toward the chair until you feel a stretch at your ankle or calf. If you do not feel a stretch, slide your bottom forward to the edge of the chair, while still keeping your heel down. Hold this stretch for 10 seconds. Repeat 3 times. Complete this stretch 2 times per day.   STRETCH  Gastroc, Standing Place hands on wall. Extend right / left leg, keeping the front knee somewhat bent. Slightly point your toes inward on your back foot. Keeping your right / left heel on the floor and your knee straight, shift your weight toward the wall, not allowing your back to arch. You should feel a gentle stretch in the right / left calf. Hold this position for 10 seconds. Repeat 3 times. Complete this stretch 2   times per day.  STRETCH  Soleus, Standing Place hands on wall. Extend right / left leg, keeping the other knee somewhat bent. Slightly point your toes inward on your back foot. Keep your right / left heel on the floor, bend your back knee, and slightly shift your weight over the back leg so that you feel a gentle stretch deep in your back calf. Hold this position for 10 seconds. Repeat 3 times. Complete this stretch 2 times per day.  STRETCH  Gastrocsoleus, Standing  Note: This exercise can place a lot of stress on your foot and ankle. Please  complete this exercise only if specifically instructed by your caregiver.  Place the ball of your right / left foot on a step, keeping your other foot firmly on the same step. Hold on to the wall or a rail for balance. Slowly lift your other foot, allowing your body weight to press your heel down over the edge of the step. You should feel a stretch in your right / left calf. Hold this position for 10 seconds. Repeat this exercise with a slight bend in your right / left knee. Repeat 3 times. Complete this stretch 2 times per day.   STRENGTHENING EXERCISES - Plantar Fasciitis (Heel Spur Syndrome)  These exercises may help you when beginning to rehabilitate your injury. They may resolve your symptoms with or without further involvement from your physician, physical therapist or athletic trainer. While completing these exercises, remember:  Muscles can gain both the endurance and the strength needed for everyday activities through controlled exercises. Complete these exercises as instructed by your physician, physical therapist or athletic trainer. Progress the resistance and repetitions only as guided.  STRENGTH - Towel Curls Sit in a chair positioned on a non-carpeted surface. Place your foot on a towel, keeping your heel on the floor. Pull the towel toward your heel by only curling your toes. Keep your heel on the floor. Repeat 3 times. Complete this exercise 2 times per day.  STRENGTH - Ankle Inversion Secure one end of a rubber exercise band/tubing to a fixed object (table, pole). Loop the other end around your foot just before your toes. Place your fists between your knees. This will focus your strengthening at your ankle. Slowly, pull your big toe up and in, making sure the band/tubing is positioned to resist the entire motion. Hold this position for 10 seconds. Have your muscles resist the band/tubing as it slowly pulls your foot back to the starting position. Repeat 3 times. Complete  this exercises 2 times per day.  Document Released: 03/18/2005 Document Revised: 06/10/2011 Document Reviewed: 06/30/2008 [employer] Patient Information 2014 [employer].  

## 2023-11-05 ENCOUNTER — Encounter: Payer: Self-pay | Admitting: Podiatry

## 2023-11-05 ENCOUNTER — Ambulatory Visit (INDEPENDENT_AMBULATORY_CARE_PROVIDER_SITE_OTHER): Admitting: Podiatry

## 2023-11-05 DIAGNOSIS — M7661 Achilles tendinitis, right leg: Secondary | ICD-10-CM

## 2023-11-05 NOTE — Progress Notes (Signed)
 Patient presents today follow-up plantar fasciitis right.  Doing much better at this a little bit of soreness at this point.  Does not hurt all the time when walking.   Physical exam:  General appearance: Pleasant, and in no acute distress. AOx3.  Vascular: Pedal pulses: DP 2/4 bilaterally, PT 2/4 bilaterally. Mild edema lower legs bilaterally. Capillary fill time immediate..  Neurological: al Achilles reflex bilaterally.  No clonus or spasticity noted.   Dermatologic:   Skin normal temperature bilaterally.  Skin normal color, tone, and texture bilaterally.   Musculoskeletal: Slight soreness on the posterior aspect of the heel at the insertion of the Achilles tendon.  No thickening of the Achilles tendon noted.  No defects noted.  No soreness on the plantar aspect of the foot at the plantar calcaneal tubercles or plantar fascia right.    Diagnosis: 1.  Achilles tendinitis right  Plan: -Established office visit for evaluation and management level 3 - Doing better with much less pain.  Some soreness at times.  Recommended icing for 20 minutes 2 or 3 times a day.  Recommended Voltaren gel as needed.  Avoid flat soled shoes.  Continue night splint 1 to 2 hours every day.  Return as needed

## 2024-01-28 ENCOUNTER — Ambulatory Visit: Payer: Self-pay

## 2024-01-28 DIAGNOSIS — Z7185 Encounter for immunization safety counseling: Secondary | ICD-10-CM

## 2024-01-28 NOTE — Progress Notes (Signed)
 Patient presents to nurse clinic for Tdap vaccine. Per epic, Tdap was administered by OBGYN on 01/10/2020. She reports she received this during her pregnancy.   [facility] updated and vaccine record given to patient.

## 2024-02-05 ENCOUNTER — Ambulatory Visit (HOSPITAL_COMMUNITY): Payer: Self-pay

## 2024-02-10 ENCOUNTER — Encounter (HOSPITAL_COMMUNITY): Payer: Self-pay

## 2024-02-10 ENCOUNTER — Ambulatory Visit (HOSPITAL_COMMUNITY)
Admission: RE | Admit: 2024-02-10 | Discharge: 2024-02-10 | Disposition: A | Discharge status: Home / Self Care | Source: Ambulatory Visit | Attending: Emergency Medicine | Admitting: Emergency Medicine

## 2024-02-10 VITALS — BP 130/90 | HR 79 | Temp 98.7°F | Resp 14

## 2024-02-10 DIAGNOSIS — Z113 Encounter for screening for infections with a predominantly sexual mode of transmission: Secondary | ICD-10-CM | POA: Insufficient documentation

## 2024-02-10 LAB — HIV ANTIBODY (ROUTINE TESTING W REFLEX): HIV Screen 4th Generation wRfx: NONREACTIVE

## 2024-02-10 LAB — POCT URINE PREGNANCY: Preg Test, Ur: NEGATIVE

## 2024-02-10 NOTE — ED Provider Notes (Signed)
 [facility]    CSN: [id number] Arrival date & time: 02/10/24  1753      History   Chief Complaint Chief Complaint  Patient presents with   SEXUALLY TRANSMITTED DISEASE    HPI Ann Boyd is a 23 y.o. female.   Patient presents requesting STD testing.  Patient reports that she just found out that her boyfriend has been cheating on her.  Patient denies any known exposures to STDs.  Patient denies any abnormal vaginal discharge, vaginal pain, vaginal lesions, abnormal vaginal bleeding, abdominal pain, flank pain, dysuria, hematuria, and urinary frequency.  Patient is unsure of LMP.  Patient reports that she was getting Depo injections, but missed her appointment for this last month.  The history is provided by the patient and medical records.    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   Vaginal discharge 08/03/2022   Eye drainage 06/10/2022   Visit for TB skin test 01/21/2022   Screen for STD (sexually transmitted disease) 11/21/2021   Contraceptive surveillance 06/27/2020   History of COVID-19 05/01/2020   History of gestational hypertension 05/01/2020   COVID-19 affecting pregnancy in third trimester 04/04/2020    Past Surgical History:  Procedure Laterality Date   NO PAST SURGERIES      OB History     Gravida  2   Para  1   Term  1   Preterm      AB  1   Living  1      SAB      IAB      Ectopic      Multiple  0   Live Births  1            Home Medications    Prior to Admission medications   Medication Sig Start Date End Date Taking? Authorizing Provider  IBUPROFEN  PO Take by mouth.    [provider]  ferrous gluconate  (FERGON) 324 MG tablet Take 1 tablet (324 mg total) by mouth daily with  breakfast. Patient not taking: Reported on 05/01/2020 09/13/19 05/09/20  Rogers, Veronica C, CNM  medroxyPROGESTERone  (DEPO-PROVERA ) 150 MG/ML injection Inject 1 mL (150 mg total) into the muscle every 3 (three) months. 11/26/12 11/30/18  Funches, Josalyn, MD    Family History Family History  Problem Relation Age of Onset   Healthy Mother    Hypertension Mother    Healthy Father    ADD / ADHD Brother     Social History Social History   Tobacco Use   Smoking status: Never    Passive exposure: Never   Smokeless tobacco: Never  Vaping Use   Vaping status: Never Used  Substance Use Topics   Alcohol use: No   Drug use: No     Allergies   Patient has no known allergies.   Review of Systems Review of Systems  Per HPI  Physical Exam Triage Vital Signs ED Triage Vitals  Encounter Vitals Group     BP 02/10/24 1811 (!) 130/90     Girls Systolic BP Percentile --      Girls Diastolic BP Percentile --      Boys Systolic BP Percentile --      Boys Diastolic BP Percentile --  Pulse Rate 02/10/24 1811 79     Resp 02/10/24 1811 14     Temp 02/10/24 1811 98.7 F (37.1 C)     Temp Source 02/10/24 1811 Oral     SpO2 02/10/24 1811 97 %     Weight --      Height --      Head Circumference --      Peak Flow --      Pain Score 02/10/24 1810 0     Pain Loc --      Pain Education --      Exclude from Growth Chart --    No data found.  Updated Vital Signs BP (!) 130/90 (BP Location: Left Arm)   Pulse 79   Temp 98.7 F (37.1 C) (Oral)   Resp 14   LMP  (LMP Unknown) Comment: stopped depo no cycle since last injection  SpO2 97%   Visual Acuity Right Eye Distance:   Left Eye Distance:   Bilateral Distance:    Right Eye Near:   Left Eye Near:    Bilateral Near:     Physical Exam Vitals and nursing note reviewed.  Constitutional:      General: She is awake. She is not in acute distress.    Appearance: Normal appearance. She is well-developed and well-groomed. She is  not ill-appearing.  Genitourinary:    Comments: Exam deferred Skin:    General: Skin is warm and dry.  Neurological:     Mental Status: She is alert.  Psychiatric:        Behavior: Behavior is cooperative.      UC Treatments / Results  Labs (all labs ordered are listed, but only abnormal results are displayed) Labs Reviewed  HIV ANTIBODY (ROUTINE TESTING W REFLEX)  RPR  POCT URINE PREGNANCY  CERVICOVAGINAL ANCILLARY ONLY    EKG   Radiology No results found.  Procedures Procedures (including critical care time)  Medications Ordered in UC Medications - No data to display  Initial Impression / Assessment and Plan / UC Course  I have reviewed the triage vital signs and the nursing notes.  Pertinent labs & imaging results that were available during my care of the patient were reviewed by me and considered in my medical decision making (see chart for details).     Patient is overall well-appearing.  Vitals are stable.  GU exam deferred.  Patient perform self swab for STD.  HIV and RPR ordered.  UPT negative.  Discussed follow-up and return precautions. Final Clinical Impressions(s) / UC Diagnoses   Final diagnoses:  Screen for STD (sexually transmitted disease)     Discharge Instructions      Your pregnancy test is negative.  Your results will come back over the next few days and someone will call if results are positive and require treatment.  Return here as needed.     ED Prescriptions   None    PDMP not reviewed this encounter.   Johnie Flaming A, NP 02/10/24 [phone]

## 2024-02-10 NOTE — ED Notes (Signed)
 Pt states she can not urinate for pregnancy

## 2024-02-10 NOTE — ED Triage Notes (Signed)
 Pt states partner cheated and she would like STI testing. Pt would like cyto and blood work.

## 2024-02-10 NOTE — Discharge Instructions (Addendum)
 Your pregnancy test is negative. Your results will come back over the next few days and someone will call if results are positive and require treatment.  Return here as needed.

## 2024-02-11 LAB — RPR: RPR Ser Ql: NONREACTIVE

## 2024-02-11 LAB — CERVICOVAGINAL ANCILLARY ONLY
Chlamydia: NEGATIVE
Comment: NEGATIVE
Comment: NEGATIVE
Comment: NORMAL
Neisseria Gonorrhea: NEGATIVE
Trichomonas: NEGATIVE

## 2024-05-06 ENCOUNTER — Inpatient Hospital Stay (HOSPITAL_COMMUNITY): Admission: RE | Admit: 2024-05-06 | Discharge: 2024-05-06 | Payer: Self-pay

## 2024-05-06 VITALS — BP 139/83 | HR 83 | Temp 98.3°F | Resp 18

## 2024-05-06 DIAGNOSIS — Z113 Encounter for screening for infections with a predominantly sexual mode of transmission: Secondary | ICD-10-CM

## 2024-05-06 LAB — POCT URINE PREGNANCY: Preg Test, Ur: NEGATIVE

## 2024-05-06 LAB — POCT URINALYSIS DIP (MANUAL ENTRY)
Bilirubin, UA: NEGATIVE
Blood, UA: NEGATIVE
Glucose, UA: NEGATIVE mg/dL
Ketones, POC UA: NEGATIVE mg/dL
Nitrite, UA: NEGATIVE
Protein Ur, POC: NEGATIVE mg/dL
Spec Grav, UA: 1.02
Urobilinogen, UA: 0.2 U/dL
pH, UA: 7

## 2024-05-06 NOTE — ED Triage Notes (Signed)
 Patient presents to the office for vaginal discharge x 1 week. Denies any recent exposure.

## 2024-05-06 NOTE — Discharge Instructions (Addendum)
 Your swab has been sent for testing, staff will call you in 2-3 days if you test positive for any infections and will provide treatment at that time.  Avoid sexual intercourse for 7 days if you start treatment. To minimize the risk of reinfection, you should abstain from sexual intercourse until your sexual partners have been tested and treated.  Advise your sexual partner(s) to be evaluated, tested and treated.  This includes all sexual partners within the past 60 days or your last sexual partner if last contact was greater than 60 days.  Return to urgent care as needed and follow-up with your primary care provider for further evaluation and management of your symptoms..  I hope you feel better!

## 2024-05-06 NOTE — ED Provider Notes (Signed)
 " [facility]    CSN: [id number] Arrival date & time: 05/06/24  1658      History   Chief Complaint Chief Complaint  Patient presents with   Exposure to STD    Entered by patient    HPI Ann Boyd is a 24 y.o. female.   Presenting to urgent care requesting STI testing.  Currently asymptomatic and without recent known STI exposure.  Sexually active with single female partner(s) without protection. Denies urinary symptoms, N/V/D, pelvic/abdominal pain, new low back pain, fever/chills.  Started having discharge about a week ago, occasionally has green discharge but inconsistent She currently does not have insurance so she is not taking Depo anymore   The history is provided by the patient.  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   Vaginal discharge 08/03/2022   Eye drainage 06/10/2022   Visit for TB skin test 01/21/2022   Screen for STD (sexually transmitted disease) 11/21/2021   Contraceptive surveillance 06/27/2020   History of COVID-19 05/01/2020   History of gestational hypertension 05/01/2020   COVID-19 affecting pregnancy in third trimester 04/04/2020    Past Surgical History:  Procedure Laterality Date   NO PAST SURGERIES      OB History     Gravida  2   Para  1   Term  1   Preterm      AB  1   Living  1      SAB      IAB      Ectopic      Multiple  0   Live Births  1            Home Medications    Prior to Admission medications  Medication Sig Start Date End Date Taking? Authorizing Provider  IBUPROFEN  PO Take by mouth.    [provider]  ferrous gluconate  (FERGON) 324 MG tablet Take 1 tablet (324 mg total) by mouth daily with breakfast. Patient not taking: Reported  on 05/01/2020 09/13/19 05/09/20  Rogers, Veronica C, CNM  medroxyPROGESTERone  (DEPO-PROVERA ) 150 MG/ML injection Inject 1 mL (150 mg total) into the muscle every 3 (three) months. 11/26/12 11/30/18  Funches, Josalyn, MD    Family History Family History  Problem Relation Age of Onset   Healthy Mother    Hypertension Mother    Healthy Father    ADD / ADHD Brother     Social History Social History[1]   Allergies   Patient has no known allergies.   Review of Systems Review of Systems   Physical Exam Triage Vital Signs ED Triage Vitals  Encounter Vitals Group     BP 05/06/24 1711 139/83     Girls Systolic BP Percentile --      Girls Diastolic BP Percentile --      Boys Systolic BP Percentile --      Boys Diastolic BP Percentile --      Pulse Rate 05/06/24 1711 83     Resp 05/06/24 1712 18     Temp 05/06/24 1711 98.3 F (36.8 C)     Temp Source 05/06/24 1711 Oral     SpO2 05/06/24 1712 97 %     Weight --  Height --      Head Circumference --      Peak Flow --      Pain Score 05/06/24 1712 0     Pain Loc --      Pain Education --      Exclude from Growth Chart --    No data found.  Updated Vital Signs BP 139/83   Pulse 83   Temp 98.3 F (36.8 C) (Oral)   Resp 18   LMP 01/31/2024 (Approximate)   SpO2 97%   Visual Acuity Right Eye Distance:   Left Eye Distance:   Bilateral Distance:    Right Eye Near:   Left Eye Near:    Bilateral Near:     Physical Exam Vitals and nursing note reviewed.  Constitutional:      General: She is not in acute distress.    Appearance: She is well-developed.  HENT:     Head: Normocephalic and atraumatic.  Eyes:     Conjunctiva/sclera: Conjunctivae normal.  Cardiovascular:     Rate and Rhythm: Normal rate and regular rhythm.     Heart sounds: No murmur heard. Pulmonary:     Effort: Pulmonary effort is normal. No respiratory distress.     Breath sounds: Normal breath sounds.  Abdominal:     Palpations: Abdomen is soft.      Tenderness: There is no abdominal tenderness.  Genitourinary:    Comments: Deferred No abdominal pain Musculoskeletal:        General: No swelling.     Cervical back: Neck supple.  Skin:    General: Skin is warm and dry.     Capillary Refill: Capillary refill takes less than 2 seconds.  Neurological:     Mental Status: She is alert.  Psychiatric:        Mood and Affect: Mood normal.      UC Treatments / Results  Labs (all labs ordered are listed, but only abnormal results are displayed) Labs Reviewed  POCT URINALYSIS DIP (MANUAL ENTRY) - Abnormal; Notable for the following components:      Result Value   Leukocytes, UA Trace (*)    All other components within normal limits  POCT URINE PREGNANCY - Normal  CERVICOVAGINAL ANCILLARY ONLY    EKG   Radiology No results found.  Procedures Procedures (including critical care time)  Medications Ordered in UC Medications - No data to display  Initial Impression / Assessment and Plan / UC Course  I have reviewed the triage vital signs and the nursing notes.  Pertinent labs & imaging results that were available during my care of the patient were reviewed by me and considered in my medical decision making (see chart for details).  UA, pregnancy and STI swab sent UA and Pregnancy negative  We will follow-up with her to treat any positive infection as needed    Final Clinical Impressions(s) / UC Diagnoses   Final diagnoses:  Screen for STD (sexually transmitted disease)     Discharge Instructions      Your swab has been sent for testing, staff will call you in 2-3 days if you test positive for any infections and will provide treatment at that time.  Avoid sexual intercourse for 7 days if you start treatment. To minimize the risk of reinfection, you should abstain from sexual intercourse until your sexual partners have been tested and treated.  Advise your sexual partner(s) to be evaluated, tested and treated.   This includes all sexual partners within  the past 60 days or your last sexual partner if last contact was greater than 60 days.  Return to urgent care as needed and follow-up with your primary care provider for further evaluation and management of your symptoms..  I hope you feel better!           ED Prescriptions   None    PDMP not reviewed this encounter.     [1]  Social History Tobacco Use   Smoking status: Never    Passive exposure: Never   Smokeless tobacco: Never  Vaping Use   Vaping status: Never Used  Substance Use Topics   Alcohol use: No   Drug use: No     Remi Pippin, NP 05/06/24 1810  "

## 2024-05-07 LAB — CERVICOVAGINAL ANCILLARY ONLY
Bacterial Vaginitis (gardnerella): NEGATIVE
Candida Glabrata: NEGATIVE
Candida Vaginitis: NEGATIVE
Chlamydia: NEGATIVE
Comment: NEGATIVE
Comment: NEGATIVE
Comment: NEGATIVE
Comment: NEGATIVE
Comment: NEGATIVE
Comment: NORMAL
Neisseria Gonorrhea: NEGATIVE
Trichomonas: NEGATIVE
